# Patient Record
Sex: Male | Born: 1960 | Race: White | Hispanic: No | Marital: Married | Smoking: Never smoker
Health system: Southern US, Community
[De-identification: ages and names within clinical notes are randomized; demographics above are authoritative.]

## PROBLEM LIST (undated history)

## (undated) DIAGNOSIS — E785 Hyperlipidemia, unspecified: Secondary | ICD-10-CM

## (undated) DIAGNOSIS — K219 Gastro-esophageal reflux disease without esophagitis: Secondary | ICD-10-CM

## (undated) DIAGNOSIS — M51369 Other intervertebral disc degeneration, lumbar region without mention of lumbar back pain or lower extremity pain: Secondary | ICD-10-CM

## (undated) DIAGNOSIS — E669 Obesity, unspecified: Secondary | ICD-10-CM

## (undated) DIAGNOSIS — M5136 Other intervertebral disc degeneration, lumbar region: Secondary | ICD-10-CM

## (undated) DIAGNOSIS — E559 Vitamin D deficiency, unspecified: Secondary | ICD-10-CM

## (undated) DIAGNOSIS — I4510 Unspecified right bundle-branch block: Secondary | ICD-10-CM

## (undated) HISTORY — DX: Unspecified right bundle-branch block: I45.10

## (undated) HISTORY — PX: COLONOSCOPY: SHX174

## (undated) HISTORY — DX: Hyperlipidemia, unspecified: E78.5

## (undated) HISTORY — DX: Gastro-esophageal reflux disease without esophagitis: K21.9

## (undated) HISTORY — PX: TONSILLECTOMY: SUR1361

---

## 1998-12-24 ENCOUNTER — Ambulatory Visit (HOSPITAL_BASED_OUTPATIENT_CLINIC_OR_DEPARTMENT_OTHER): Admission: RE | Admit: 1998-12-24 | Discharge: 1998-12-24 | Payer: Self-pay | Admitting: Orthopedic Surgery

## 1999-06-03 ENCOUNTER — Ambulatory Visit (HOSPITAL_BASED_OUTPATIENT_CLINIC_OR_DEPARTMENT_OTHER): Admission: RE | Admit: 1999-06-03 | Discharge: 1999-06-03 | Payer: Self-pay | Admitting: Orthopedic Surgery

## 2005-07-12 ENCOUNTER — Ambulatory Visit: Payer: Self-pay | Admitting: Family Medicine

## 2005-08-11 ENCOUNTER — Ambulatory Visit: Payer: Self-pay | Admitting: Family Medicine

## 2005-08-15 ENCOUNTER — Ambulatory Visit: Payer: Self-pay | Admitting: Family Medicine

## 2006-11-23 ENCOUNTER — Ambulatory Visit: Payer: Self-pay | Admitting: Family Medicine

## 2006-11-23 LAB — CONVERTED CEMR LAB
AST: 35 units/L (ref 0–37)
Bilirubin, Direct: 0.1 mg/dL (ref 0.0–0.3)
Cholesterol: 205 mg/dL (ref 0–200)
Direct LDL: 111.3 mg/dL
GFR calc Af Amer: 71 mL/min
GFR calc non Af Amer: 59 mL/min
Glucose, Bld: 99 mg/dL (ref 70–99)
Potassium: 4.8 meq/L (ref 3.5–5.1)
Sodium: 143 meq/L (ref 135–145)
TSH: 1.42 microintl units/mL (ref 0.35–5.50)
Total CHOL/HDL Ratio: 2.8
Triglycerides: 39 mg/dL (ref 0–149)
VLDL: 8 mg/dL (ref 0–40)

## 2006-11-27 ENCOUNTER — Ambulatory Visit: Payer: Self-pay | Admitting: Family Medicine

## 2006-11-27 DIAGNOSIS — J309 Allergic rhinitis, unspecified: Secondary | ICD-10-CM | POA: Insufficient documentation

## 2007-12-12 ENCOUNTER — Ambulatory Visit: Payer: Self-pay | Admitting: Family Medicine

## 2007-12-12 LAB — CONVERTED CEMR LAB
ALT: 34 units/L (ref 0–53)
AST: 31 units/L (ref 0–37)
Basophils Absolute: 0 10*3/uL (ref 0.0–0.1)
Basophils Relative: 0.4 % (ref 0.0–1.0)
Bilirubin, Direct: 0.2 mg/dL (ref 0.0–0.3)
CO2: 28 meq/L (ref 19–32)
Chloride: 106 meq/L (ref 96–112)
Creatinine, Ser: 1.4 mg/dL (ref 0.4–1.5)
Lymphocytes Relative: 29.9 % (ref 12.0–46.0)
MCHC: 34.3 g/dL (ref 30.0–36.0)
Monocytes Relative: 7.8 % (ref 3.0–12.0)
Neutrophils Relative %: 57.8 % (ref 43.0–77.0)
RBC: 4.58 M/uL (ref 4.22–5.81)
TSH: 1.58 microintl units/mL (ref 0.35–5.50)
Total Bilirubin: 1.3 mg/dL — ABNORMAL HIGH (ref 0.3–1.2)
WBC: 4.8 10*3/uL (ref 4.5–10.5)

## 2007-12-20 ENCOUNTER — Ambulatory Visit: Payer: Self-pay | Admitting: Family Medicine

## 2007-12-20 DIAGNOSIS — E785 Hyperlipidemia, unspecified: Secondary | ICD-10-CM

## 2008-12-23 ENCOUNTER — Ambulatory Visit: Payer: Self-pay | Admitting: Family Medicine

## 2008-12-23 LAB — CONVERTED CEMR LAB
CO2: 29 meq/L (ref 19–32)
Calcium: 9.2 mg/dL (ref 8.4–10.5)
Creatinine, Ser: 1.3 mg/dL (ref 0.4–1.5)
Glucose, Bld: 85 mg/dL (ref 70–99)
TSH: 1.63 microintl units/mL (ref 0.35–5.50)
Total CHOL/HDL Ratio: 3

## 2008-12-29 ENCOUNTER — Ambulatory Visit: Payer: Self-pay | Admitting: Family Medicine

## 2009-11-17 HISTORY — PX: LASIK: SHX215

## 2010-01-11 ENCOUNTER — Ambulatory Visit: Payer: Self-pay | Admitting: Family Medicine

## 2010-01-11 LAB — CONVERTED CEMR LAB
ALT: 25 units/L (ref 0–53)
AST: 29 units/L (ref 0–37)
Bilirubin, Direct: 0.1 mg/dL (ref 0.0–0.3)
Chloride: 107 meq/L (ref 96–112)
Potassium: 4.8 meq/L (ref 3.5–5.1)
TSH: 1.56 microintl units/mL (ref 0.35–5.50)
Total Bilirubin: 0.8 mg/dL (ref 0.3–1.2)
Total CHOL/HDL Ratio: 3

## 2010-01-18 ENCOUNTER — Ambulatory Visit: Payer: Self-pay | Admitting: Family Medicine

## 2010-03-15 ENCOUNTER — Encounter (INDEPENDENT_AMBULATORY_CARE_PROVIDER_SITE_OTHER): Payer: Self-pay | Admitting: *Deleted

## 2010-09-06 NOTE — Letter (Signed)
Summary: Nadara Eaton letter  [place] at [facility]  [address] [place], [place] [address]   Phone: [phone]  Fax: [phone]       03/15/2010 MRN: [id number]  [facility] [address] [place], [place]  [address]  Dear Mr. GUAY,  [facility] [place], and [facility] announce the retirement of Arta Silence, M.D., from full-time practice at the [facility] office effective February 03, 2010 and his plans of returning part-time.  It is important to Dr. Hetty Ely and to our practice that you understand that [facility] has seven physicians in our office for your health care needs.  We will continue to offer the same exceptional care that you have today.    Dr. Hetty Ely has spoken to many of you about his plans for retirement and returning part-time in the fall.   We will continue to work with you through the transition to schedule appointments for you in the office and meet the high standards that [place] is committed to.   Again, it is with great pleasure that we share the news that Dr. Hetty Ely will return to [facility] in October of 2011 with a reduced schedule.    If you have any questions, or would like to request an appointment with one of our physicians, please call us at [phone] and press the option for Scheduling an appointment.  We take pleasure in providing you with excellent patient care and look forward to seeing you at your next office visit.  Our [facility] Physicians are:  Tillman Abide, M.D. Laurita Quint, M.D. Roxy Manns, M.D. Kerby Nora, M.D. Hannah Beat, M.D. Ruthe Mannan, M.D. We proudly welcomed Raechel Ache, M.D. and Eustaquio Boyden, M.D. to the practice in July/August 2011.  Sincerely,  [facility]

## 2010-09-06 NOTE — Assessment & Plan Note (Signed)
Summary: cpx/dlo   Vital Signs:  Patient profile:   50 year old male Height:      70 inches Weight:      217 pounds BMI:     31.25 Temp:     98.6 degrees F oral Pulse rate:   60 / minute Pulse rhythm:   regular BP sitting:   104 / 70  (left arm) Cuff size:   large  Vitals Entered By: Sydell Axon LPN (January 18, 2010 3:00 PM) CC: 30 Minute checkup   History of Present Illness: Pt here for Comp Exam and feeling well. nHe has no complaints today and feeling well. He had a great year for allergies.   Preventive Screening-Counseling & Management  Alcohol-Tobacco     Alcohol drinks/day: 3-4 per week     Alcohol type: wine     Alcohol Counseling: not indicated; patient does not drink     Smoking Status: never     Passive Smoke Exposure: no  Caffeine-Diet-Exercise     Caffeine use/day: 2     Does Patient Exercise: yes     Type of exercise: lifts, runs, bicycle     Times/week: 7  Problems Prior to Update: 1)  Hyperlipidemia  (ICD-272.4) 2)  Health Maintenance Exam  (ICD-V70.0) 3)  Allergy  (ICD-995.3)  Medications Prior to Update: 1)  Claritin 10 Mg  Tabs (Loratadine) .... Takes Seasonally// 1 Tablet By Mouth Daily Prn 2)  Diphenhydramine Hcl 25 Mg  Caps (Diphenhydramine Hcl) .Marland Kitchen.. 1 By Mouth As Needed Allergies  Allergies: No Known Drug Allergies  Past History:  Past Medical History: Last updated: 11/27/2006 GERD (07/2005) Hyperlipidemia (08/2005)  Family History: Last updated: 01/18/2010 Father A 17  BPH Mother A 39 Brother A 51 Hyperactive Thyr I131 Sister A 50  Social History: Last updated: 01/18/2010 Occupation: Art gallery manager, [employer]    [employer]    Mech Engr/ [place] Married 2 Daughters Never Smoked Alcohol use-yes Drug use-no  Risk Factors: Alcohol Use: 3-4 per week (01/18/2010) Caffeine Use: 2 (01/18/2010) Exercise: yes (01/18/2010)  Risk Factors: Smoking Status: never (01/18/2010) Passive Smoke Exposure: no (01/18/2010)  Past Surgical  History: TONSILLECTOMY 7 YOA LASIK (Dr Judie Grieve, [place], [facility]) 11/17/2009  Family History: Father A 34  BPH Mother A 23 Brother A 51 Hyperactive Thyr I131 Sister A 50  Social History: Occupation: Art gallery manager, [employer]    [employer] of [employer]    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 Eyes:  Denies blurring, discharge, and eye pain. ENT:  Denies decreased hearing, earache, and ringing in ears. CV:  Denies chest pain or discomfort, fainting, fatigue, palpitations, shortness of breath with exertion, swelling of feet, and swelling of hands. Resp:  Denies cough, pleuritic, shortness of breath, and wheezing. GI:  Denies abdominal pain, bloody stools, change in bowel habits, constipation, dark tarry stools, diarrhea, indigestion, loss of appetite, nausea, vomiting, vomiting blood, and yellowish skin color. GU:  Denies dysuria, nocturia, urinary frequency, and urinary hesitancy. MS:  Denies joint pain, low back pain, muscle aches, cramps, muscle weakness, and stiffness. Derm:  Denies dryness, itching, and rash. Neuro:  Denies numbness, poor balance, tingling, and tremors.  Physical Exam  General:  Well-developed,well-nourished,in no acute distress; alert,appropriate and cooperative throughout examination Head:  Normocephalic and atraumatic without obvious abnormalities. No apparent alopecia or balding. Sinuses nontender. Eyes:  Conjunctiva clear bilaterally.  Ears:  External ear exam shows no significant lesions or deformities.  Otoscopic examination reveals clear canals, tympanic membranes are intact bilaterally without bulging, retraction, inflammation or discharge. Hearing is grossly normal bilaterally. Nose:  External nasal examination shows no deformity or inflammation. Nasal mucosa are pink and moist without lesions or exudates. Mouth:  Oral mucosa and  oropharynx without lesions or exudates.  Teeth in good repair. Neck:  No deformities, masses, or tenderness noted. Chest Wall:  No deformities, masses, tenderness or gynecomastia noted. Breasts:  No masses or gynecomastia noted Lungs:  Normal respiratory effort, chest expands symmetrically. Lungs are clear to auscultation, no crackles or wheezes. Heart:  Normal rate and regular rhythm. S1 and S2 normal without gallop, murmur, click, rub or other extra sounds. Abdomen:  Bowel sounds positive,abdomen soft and non-tender without masses, organomegaly or hernias noted. Rectal:  No external abnormalities noted. Normal sphincter tone. No rectal masses or tenderness. Externak deflated hemms right sided. G neg. Genitalia:  Testes bilaterally descended without nodularity, tenderness or masses. No scrotal masses or lesions. No penis lesions or urethral discharge. Prostate:  Prostate gland firm and smooth, no enlargement, nodularity, tenderness, mass, asymmetry or induration. 20-30gms. Msk:  No deformity or scoliosis noted of thoracic or lumbar spine.   Pulses:  R and L carotid,radial,femoral,dorsalis pedis and posterior tibial pulses are full and equal bilaterally Extremities:  No clubbing, cyanosis, edema, or deformity noted with normal full range of motion of all joints.   Neurologic:  No cranial nerve deficits noted. Station and gait are normal.  Sensory, motor and coordinative functions appear intact. Skin:  Intact without suspicious lesions or rashes, small 5mm SK on RLLeg. Cervical Nodes:  No lymphadenopathy noted Inguinal Nodes:  No significant adenopathy Psych:  Cognition and judgment appear intact. Alert and cooperative with normal attention span and concentration. No apparent delusions, illusions, hallucinations   Impression & Recommendations:  Problem # 1:  HEALTH MAINTENANCE EXAM (ICD-V70.0) Assessment Comment Only  Reviewed preventive care protocols, scheduled due services, and updated  immunizations.  Problem # 2:  HYPERLIPIDEMIA (ICD-272.4) Assessment: Unchanged OK but always want LDL lower, avoid fatty foods. Labs Reviewed: SGOT: 29 (01/11/2010)   SGPT: 25 (01/11/2010)   HDL:80.70 (01/11/2010), 66.10 (12/23/2008)  LDL:108 (12/23/2008), DEL (11/23/2006)  Chol:211 (01/11/2010), 186 (12/23/2008)  Trig:51.0 (01/11/2010), 60.0 (12/23/2008)  Problem # 3:  ALLERGY (ICD-995.3) Assessment: Improved Weel controlled this year, no meds required.  Complete Medication List: 1)  Claritin 10 Mg Tabs (Loratadine) .... Takes seasonally// 1 tablet by mouth daily prn 2)  Diphenhydramine Hcl 25 Mg Caps (Diphenhydramine hcl) .Marland Kitchen.. 1 by mouth as needed allergies  Patient Instructions: 1)  RTC one year, sooner as needed.  Current Allergies (reviewed today): No known allergies

## 2011-02-11 ENCOUNTER — Encounter: Payer: Self-pay | Admitting: Family Medicine

## 2011-02-28 ENCOUNTER — Other Ambulatory Visit: Payer: Self-pay | Admitting: Family Medicine

## 2011-02-28 DIAGNOSIS — Z0000 Encounter for general adult medical examination without abnormal findings: Secondary | ICD-10-CM

## 2011-02-28 DIAGNOSIS — E785 Hyperlipidemia, unspecified: Secondary | ICD-10-CM

## 2011-03-02 ENCOUNTER — Other Ambulatory Visit (INDEPENDENT_AMBULATORY_CARE_PROVIDER_SITE_OTHER): Payer: 59 | Admitting: Family Medicine

## 2011-03-02 DIAGNOSIS — Z0000 Encounter for general adult medical examination without abnormal findings: Secondary | ICD-10-CM

## 2011-03-02 DIAGNOSIS — E785 Hyperlipidemia, unspecified: Secondary | ICD-10-CM

## 2011-03-02 LAB — BASIC METABOLIC PANEL
BUN: 21 mg/dL (ref 6–23)
CO2: 25 mEq/L (ref 19–32)
Calcium: 8.9 mg/dL (ref 8.4–10.5)
GFR: 69.08 mL/min (ref >60.00)
Glucose, Bld: 91 mg/dL (ref 70–99)

## 2011-03-02 LAB — HEPATIC FUNCTION PANEL
ALT: 47 U/L (ref 0–53)
Alkaline Phosphatase: 64 U/L (ref 39–117)
Bilirubin, Direct: 0 mg/dL (ref 0.0–0.3)
Total Bilirubin: 0.8 mg/dL (ref 0.3–1.2)
Total Protein: 7.2 g/dL (ref 6.0–8.3)

## 2011-03-09 ENCOUNTER — Ambulatory Visit (INDEPENDENT_AMBULATORY_CARE_PROVIDER_SITE_OTHER): Payer: 59 | Admitting: Family Medicine

## 2011-03-09 ENCOUNTER — Encounter: Payer: Self-pay | Admitting: Family Medicine

## 2011-03-09 DIAGNOSIS — T7840XA Allergy, unspecified, initial encounter: Secondary | ICD-10-CM

## 2011-03-09 DIAGNOSIS — B369 Superficial mycosis, unspecified: Secondary | ICD-10-CM | POA: Insufficient documentation

## 2011-03-09 DIAGNOSIS — E785 Hyperlipidemia, unspecified: Secondary | ICD-10-CM

## 2011-03-09 MED ORDER — NYSTATIN 100000 UNIT/GM EX CREA: TOPICAL_CREAM | Freq: Two times a day (BID) | CUTANEOUS | Status: AC

## 2011-03-09 NOTE — Progress Notes (Signed)
Subjective:    Patient ID: Alejandro Decker, male    DOB: 01/07/1961, 50 y.o.   MRN: [id number]  HPI Pt here for Comp Exam. He is doing well, as far as he knows. He has no ailments or complaints.    Review of Systems  Constitutional: Negative for fever, chills, diaphoresis, appetite change, fatigue and unexpected weight change.  HENT: Negative for hearing loss, ear pain, tinnitus and ear discharge.   Eyes: Negative for pain, discharge and visual disturbance.  Respiratory: Negative for cough, shortness of breath and wheezing.   Cardiovascular: Negative for chest pain and palpitations.       No SOB w/ exertion  Gastrointestinal: Negative for nausea, vomiting, abdominal pain, diarrhea, constipation and blood in stool.       No heartburn or swallowing problems.  Genitourinary: Negative for dysuria, frequency and difficulty urinating.       No nocturia  Musculoskeletal: Negative for myalgias, back pain and arthralgias.  Skin: Negative for rash.       No itching or dryness.  Neurological: Negative for tremors and numbness.       No tingling or balance problems.  Hematological: Negative for adenopathy. Does not bruise/bleed easily.  Psychiatric/Behavioral: Negative for dysphoric mood and agitation.   No Known Allergies  Current Outpatient Prescriptions on File Prior to Visit  Medication Sig Dispense Refill  . diphenhydrAMINE (BENADRYL) 25 mg capsule Take 25 mg by mouth as needed.        . loratadine (CLARITIN) 10 MG tablet Take 10 mg by mouth daily as needed.          Past Medical History  Diagnosis Date  . GERD (gastroesophageal reflux disease)   . Hyperlipemia     History   Social History  . Marital Status: Married    Spouse Name: N/A    Number of Children: 2  . Years of Education: N/A   Occupational History  . Engineer, [employer]    Mech Engr/[place]   Social History Main Topics  . Smoking status: Never Smoker   . Smokeless tobacco: Never Used    . Alcohol Use: 3.0 oz/week    6 drink(s) per week     weekly  . Drug Use: No  . Sexually Active: Yes   Other Topics Concern  . Not on file   Social History Narrative   2 daughters    Family History  Problem Relation Age of Onset  . Benign prostatic hyperplasia Father         Objective:   Physical Exam  Constitutional: He is oriented to person, place, and time. He appears well-developed and well-nourished. No distress.  HENT:  Head: Normocephalic and atraumatic.  Right Ear: External ear normal.  Left Ear: External ear normal.  Nose: Nose normal.  Mouth/Throat: Oropharynx is clear and moist.  Eyes: Conjunctivae and EOM are normal. Pupils are equal, round, and reactive to light. Right eye exhibits no discharge. Left eye exhibits no discharge. No scleral icterus.  Neck: Normal range of motion. Neck supple. No thyromegaly present.  Cardiovascular: Normal rate, regular rhythm, normal heart sounds and intact distal pulses.   No murmur heard. Pulmonary/Chest: Effort normal and breath sounds normal. No respiratory distress. He has no wheezes.  Abdominal: Soft. Bowel sounds are normal. He exhibits no distension and no mass. There is no tenderness. There is no rebound and no guarding.  Genitourinary: Rectum normal, prostate normal and penis normal. Guaiac negative stool.  Prostate 20gms, firm symmetric and nontender and smooth. No hernias felt.  Musculoskeletal: Normal range of motion. He exhibits no edema.  Lymphadenopathy:    He has no cervical adenopathy.  Neurological: He is alert and oriented to person, place, and time. Coordination normal.  Skin: Skin is warm and dry. No rash noted. He is not diaphoretic.       Mild erythematous rash on the top of the right foot with more intense erythematous outline, scalloped borders.  Psychiatric: He has a normal mood and affect. His behavior is normal. Judgment and thought content normal.          Assessment & Plan:  HMPE

## 2011-03-09 NOTE — Assessment & Plan Note (Signed)
Well controlled 

## 2011-03-09 NOTE — Assessment & Plan Note (Signed)
Great control. All nos good to terrific.

## 2011-03-09 NOTE — Patient Instructions (Signed)
RTC as needed

## 2011-03-09 NOTE — Assessment & Plan Note (Signed)
L foot. Use Nystatin cream, apply bid for one week after rash looks resolved.

## 2012-03-06 ENCOUNTER — Other Ambulatory Visit: Payer: Self-pay | Admitting: Family Medicine

## 2012-03-06 DIAGNOSIS — E785 Hyperlipidemia, unspecified: Secondary | ICD-10-CM

## 2012-03-11 ENCOUNTER — Other Ambulatory Visit (INDEPENDENT_AMBULATORY_CARE_PROVIDER_SITE_OTHER): Payer: 59

## 2012-03-11 DIAGNOSIS — E785 Hyperlipidemia, unspecified: Secondary | ICD-10-CM

## 2012-03-11 LAB — COMPREHENSIVE METABOLIC PANEL
ALT: 36 U/L (ref 0–53)
Albumin: 4.3 g/dL (ref 3.5–5.2)
CO2: 27 mEq/L (ref 19–32)
Calcium: 9.2 mg/dL (ref 8.4–10.5)
Chloride: 106 mEq/L (ref 96–112)
Creatinine, Ser: 1.2 mg/dL (ref 0.4–1.5)
GFR: 68.79 mL/min (ref >60.00)
Total Protein: 6.9 g/dL (ref 6.0–8.3)

## 2012-03-11 LAB — LIPID PANEL
Cholesterol: 206 mg/dL — ABNORMAL HIGH (ref 0–200)
Total CHOL/HDL Ratio: 3

## 2012-03-14 ENCOUNTER — Encounter: Payer: Self-pay | Admitting: Family Medicine

## 2012-03-14 ENCOUNTER — Ambulatory Visit (INDEPENDENT_AMBULATORY_CARE_PROVIDER_SITE_OTHER): Payer: 59 | Admitting: Family Medicine

## 2012-03-14 VITALS — BP 116/78 | HR 72 | Temp 98.1°F | Ht 70.0 in | Wt 227.0 lb

## 2012-03-14 DIAGNOSIS — E785 Hyperlipidemia, unspecified: Secondary | ICD-10-CM

## 2012-03-14 DIAGNOSIS — Z0000 Encounter for general adult medical examination without abnormal findings: Secondary | ICD-10-CM

## 2012-03-14 NOTE — Assessment & Plan Note (Signed)
Preventative protocols reviewed and updated unless pt declined. Discussed healthy diet/lifestyle. 

## 2012-03-14 NOTE — Patient Instructions (Signed)
Good to see you today, call us with questions. Return in 1 year or as needed

## 2012-03-14 NOTE — Progress Notes (Signed)
Subjective:    Patient ID: Alejandro Decker, male    DOB: Apr 30, 1961, 51 y.o.   MRN: [id number]  HPI CC: CPE  No questions or concerns today.  Preventative: Last tetanus 2010 No fmhx colon or prostate cancer.  Nocturia x1, strong stream.  No BM changes, no blood in stool noted.  Caffeine: 4-5 cups coffee/day Lives with wife and 2 daughters, 3 dogs Occupation: Production designer, theatre/television/film at [employer]: works out 6d/wk, cardio and [employer] Diet: some water, fruits/vegetables daily, no fast food  Medications and allergies reviewed and updated in chart.  Past histories reviewed and updated if relevant as below. Patient Active Problem List  Diagnosis  . HYPERLIPIDEMIA  . ALLERGY   Past Medical History  Diagnosis Date  . GERD (gastroesophageal reflux disease)   . Hyperlipemia    Past Surgical History  Procedure Date  . Tonsillectomy 7 yoa  . Lasik 11/17/2009    Dr. Judie Grieve, [place], [facility]   History  Substance Use Topics  . Smoking status: Never Smoker   . Smokeless tobacco: Never Used  . Alcohol Use: 3.0 oz/week    6 drink(s) per week     weekly   Family History  Problem Relation Age of Onset  . Benign prostatic hyperplasia Father   . Diabetes Maternal Grandfather   . Parkinsonism Paternal Grandmother   . Heart disease Maternal Grandfather   . Stroke Maternal Grandmother   . Coronary artery disease Neg Hx   . Cancer Neg Hx    No Known Allergies Current Outpatient Prescriptions on File Prior to Visit  Medication Sig Dispense Refill  . diphenhydrAMINE (BENADRYL) 25 mg capsule Take 25 mg by mouth as needed.        . loratadine (CLARITIN) 10 MG tablet Take 10 mg by mouth daily as needed.           Review of Systems  Constitutional: Negative for fever, chills, activity change, appetite change, fatigue and unexpected weight change.  HENT: Negative for hearing loss and neck pain.   Eyes: Negative for visual disturbance.  Respiratory: Negative for cough, chest tightness,  shortness of breath and wheezing.   Cardiovascular: Negative for chest pain, palpitations and leg swelling.  Gastrointestinal: Negative for nausea, vomiting, abdominal pain, diarrhea, constipation, blood in stool and abdominal distention.  Genitourinary: Negative for hematuria and difficulty urinating.  Musculoskeletal: Negative for myalgias and arthralgias.  Skin: Negative for rash.  Neurological: Negative for dizziness, seizures, syncope and headaches.  Hematological: Does not bruise/bleed easily.  Psychiatric/Behavioral: Negative for dysphoric mood. The patient is not nervous/anxious.        Objective:   Physical Exam  Nursing note and vitals reviewed. Constitutional: He is oriented to person, place, and time. He appears well-developed and well-nourished. No distress.  HENT:  Head: Normocephalic and atraumatic.  Right Ear: External ear normal.  Left Ear: External ear normal.  Nose: Nose normal.  Mouth/Throat: Oropharynx is clear and moist. No oropharyngeal exudate.  Eyes: Conjunctivae and EOM are normal. Pupils are equal, round, and reactive to light. No scleral icterus.  Neck: Normal range of motion. Neck supple. No thyromegaly present.  Cardiovascular: Normal rate, regular rhythm, normal heart sounds and intact distal pulses.   No murmur heard. Pulses:      Radial pulses are 2+ on the right side, and 2+ on the left side.  Pulmonary/Chest: Effort normal and breath sounds normal. No respiratory distress. He has no wheezes. He has no rales.  Abdominal: Soft. Bowel sounds are normal.  He exhibits no distension and no mass. There is no tenderness. There is no rebound and no guarding.  Musculoskeletal: Normal range of motion. He exhibits no edema.  Lymphadenopathy:    He has no cervical adenopathy.  Neurological: He is alert and oriented to person, place, and time.       CN grossly intact, station and gait intact  Skin: Skin is warm and dry. No rash noted.  Psychiatric: He has a  normal mood and affect. His behavior is normal. Judgment and thought content normal.       Assessment & Plan:

## 2012-03-14 NOTE — Assessment & Plan Note (Signed)
Very mild HLD.  Stable off meds. Discussed healthy diet changes.

## 2013-01-13 ENCOUNTER — Telehealth: Payer: Self-pay | Admitting: Family Medicine

## 2013-01-13 NOTE — Telephone Encounter (Signed)
To pcp   Hannah Beat, MD 01/13/2013, 4:51 PM

## 2013-01-13 NOTE — Telephone Encounter (Signed)
Will see then. 

## 2013-01-13 NOTE — Telephone Encounter (Signed)
Patient Information:  Caller Name: Cleotis  Phone: [phone]  Patient: Alejandro Decker, Alejandro Decker  Gender: Male  DOB: 03/11/1961  Age: 52 Years  PCP: Eustaquio Boyden [facility])  Office Follow Up:  Does the office need to follow up with this patient?: No  Instructions For The Office: N/A  RN Note:  No nasal congestion.   Symptoms  Reason For Call & Symptoms: Ear congestion with fluid and scratchy throat.  Symptoms began after swimming; had sensation of fluid in left ear for past 2 weeks and now both ears congested for past 3 days.  Reviewed Health History In EMR: Yes  Reviewed Medications In EMR: Yes  Reviewed Allergies In EMR: Yes  Reviewed Surgeries / Procedures: Yes  Date of Onset of Symptoms: 12/30/2012  Treatments Tried: OTC allergy medication  Treatments Tried Worked: No  Guideline(s) Used:  Ear - Congestion  Disposition Per Guideline:   See Within 3 Days in Office  Reason For Disposition Reached:   Ear congestion present > 48 hours  Advice Given:  Reassurance:  Definition: Ear congestion is the medical term used to describe symptoms of a stuffy, full, or plugged sensation in the ear. People also sometimes describe crackling or popping noises in the ear. Sometimes hearing seems slightly muffled.  Eustacian tube: There is a small collapsible tube that runs between the middle ear and the nose. Normally, it permits tiny amounts of air to move in and out of the middle ear. When the tube gets blocked, air or fluid can build up behind the ear drum (tympanic membrane). This causes the symptoms of ear congestion.  Here is some care advice that should help.  Causes  Blowing the nose too hard can also push air and fluid into the eustachian tube.  Air travelers can get ear congestion. This happens because of the changes in air pressure as the air plane takes off and lands.  Treatment - Chewing and Swallowing:   Try chewing gum.  You can also try swallowing water while pinching  your nostrils closed. The reason this works is that it creates a small vacuum in the nose. This helps the eustachian tube to open up.  Expected Course:   The symptoms usually get better within 2 days (48 hours) with treatment.  It is safe to swim.  Call Back If:   Ear pain or fever occurs  You become worse.  Patient Will Follow Care Advice:  YES  Appointment Scheduled:  01/14/2013 14:00:00 Appointment Scheduled Provider:  Eustaquio Boyden [facility])

## 2013-01-14 ENCOUNTER — Ambulatory Visit (INDEPENDENT_AMBULATORY_CARE_PROVIDER_SITE_OTHER): Payer: 59 | Admitting: Family Medicine

## 2013-01-14 ENCOUNTER — Encounter: Payer: Self-pay | Admitting: Family Medicine

## 2013-01-14 VITALS — BP 106/64 | HR 68 | Temp 98.1°F | Wt 230.0 lb

## 2013-01-14 DIAGNOSIS — H6593 Unspecified nonsuppurative otitis media, bilateral: Secondary | ICD-10-CM | POA: Insufficient documentation

## 2013-01-14 DIAGNOSIS — H6590 Unspecified nonsuppurative otitis media, unspecified ear: Secondary | ICD-10-CM

## 2013-01-14 MED ORDER — FLUTICASONE PROPIONATE 50 MCG/ACT NA SUSP: 2.0000 | Freq: Every day | NASAL | Status: DC

## 2013-01-14 NOTE — Patient Instructions (Signed)
I do think you have fluid remaining in both ears Treat with ibuprofen as needed, and flonase nasal steroid as well as nasal saline as needed. Let us know if persisting into next few weeks.

## 2013-01-14 NOTE — Assessment & Plan Note (Signed)
Without infection. Treat with NSAID, INS, and supportive care (increased fluids). If persistent to notify us, consider abx course.

## 2013-01-14 NOTE — Progress Notes (Signed)
  Subjective:    Patient ID: Alejandro Decker, male    DOB: [birth year], 52 y.o.   MRN: [id number]  HPI CC: bilateral ear congestion  2 wk h/o fluid in left ear.  Started after swimming.  Last Friday noticing fluid in right ear.  Some soreness in sinuses. No fevers/chills, ear drainage, tinnitus, hearing changes.  No rhinorhea, coryza or cough.  No PNdrainage or ST.   + muffled hearing.    Normally h/o seasonal allergies in early spring and fall.  Has tried allertech OTC for this. No sick contacts at home.  Past Medical History  Diagnosis Date  . GERD (gastroesophageal reflux disease)   . Hyperlipemia      Review of Systems Per HPI    Objective:   Physical Exam  Nursing note and vitals reviewed. Constitutional: He appears well-developed and well-nourished. No distress.  HENT:  Head: Normocephalic and atraumatic.  Right Ear: Hearing, tympanic membrane, external ear and ear canal normal.  Left Ear: Hearing, tympanic membrane, external ear and ear canal normal.  Nose: Mucosal edema present. No rhinorrhea. Right sinus exhibits no maxillary sinus tenderness and no frontal sinus tenderness. Left sinus exhibits no maxillary sinus tenderness and no frontal sinus tenderness.  Mouth/Throat: Oropharynx is clear and moist. No oropharyngeal exudate.  Some decreased light refiex bilaterally with increased opacity of TMs, fluid behind bilateral ears. Nasal mucosal inflammation  Eyes: Conjunctivae and EOM are normal. Pupils are equal, round, and reactive to light. No scleral icterus.  Neck: Normal range of motion. Neck supple.  Lymphadenopathy:    He has no cervical adenopathy.  Skin: Skin is warm and dry. No rash noted.       Assessment & Plan:

## 2013-03-09 ENCOUNTER — Other Ambulatory Visit: Payer: Self-pay | Admitting: Family Medicine

## 2013-03-09 DIAGNOSIS — E785 Hyperlipidemia, unspecified: Secondary | ICD-10-CM

## 2013-03-11 ENCOUNTER — Other Ambulatory Visit (INDEPENDENT_AMBULATORY_CARE_PROVIDER_SITE_OTHER): Payer: 59

## 2013-03-11 ENCOUNTER — Ambulatory Visit (INDEPENDENT_AMBULATORY_CARE_PROVIDER_SITE_OTHER): Payer: 59 | Admitting: Family Medicine

## 2013-03-11 ENCOUNTER — Encounter: Payer: Self-pay | Admitting: Family Medicine

## 2013-03-11 VITALS — BP 108/70 | HR 64 | Temp 98.3°F | Wt 230.2 lb

## 2013-03-11 DIAGNOSIS — E785 Hyperlipidemia, unspecified: Secondary | ICD-10-CM

## 2013-03-11 DIAGNOSIS — Z0000 Encounter for general adult medical examination without abnormal findings: Secondary | ICD-10-CM

## 2013-03-11 DIAGNOSIS — I8001 Phlebitis and thrombophlebitis of superficial vessels of right lower extremity: Secondary | ICD-10-CM | POA: Insufficient documentation

## 2013-03-11 DIAGNOSIS — I8000 Phlebitis and thrombophlebitis of superficial vessels of unspecified lower extremity: Secondary | ICD-10-CM

## 2013-03-11 LAB — COMPREHENSIVE METABOLIC PANEL
Albumin: 4.2 g/dL (ref 3.5–5.2)
CO2: 24 mEq/L (ref 19–32)
Calcium: 9.1 mg/dL (ref 8.4–10.5)
Chloride: 108 mEq/L (ref 96–112)
GFR: 55.88 mL/min — ABNORMAL LOW (ref >60.00)
Glucose, Bld: 91 mg/dL (ref 70–99)
Potassium: 4.4 mEq/L (ref 3.5–5.1)
Sodium: 140 mEq/L (ref 135–145)
Total Protein: 6.9 g/dL (ref 6.0–8.3)

## 2013-03-11 LAB — LIPID PANEL: Triglycerides: 47 mg/dL (ref 0.0–149.0)

## 2013-03-11 MED ORDER — CEPHALEXIN 500 MG PO CAPS: 500.0000 mg | ORAL_CAPSULE | Freq: Three times a day (TID) | ORAL | Status: DC

## 2013-03-11 NOTE — Patient Instructions (Addendum)
I think you have vein inflammation after bug bite. Treat with ibuprofen 600mg  twice daily with food for 5 days as well as antibiotic three times daily for 7 days. Ice ankle, continue to elevate when resting, but ok to exercise (back off running for next week) Let us know if not improving as expected or any worsening.

## 2013-03-11 NOTE — Progress Notes (Signed)
  Subjective:    Patient ID: Alejandro Decker, male    DOB: 1960-09-08, 52 y.o.   MRN: [id number]  HPI CC: check R ankle swelling  Noticed some swelling of R ankle about 2 weeks, noticed red streak which seems to be enlarging.  Pain and swelling worsens 1 mi after starts running, notes mild swelling after eliptical but worse with run.    No fevers/chills, nausea, rashes.   Did have lateral ankle bug bites last week.  No inciting trauma/injury.  No recent prolonged immobility.  Not on any hormonal meds.  No personal or family h/o blood clots.  So far has taken ibuprofen 400mg  and icing after exercise which helps swelling.  Past Medical History  Diagnosis Date  . GERD (gastroesophageal reflux disease)   . Hyperlipemia    Review of Systems Per HPI    Objective:   Physical Exam  Nursing note and vitals reviewed. Constitutional: He appears well-developed and well-nourished. No distress.  Musculoskeletal: He exhibits no edema.       Legs: 42cm calf circ bilaterally Tender to palpation R foot superficial lateral vein as it travels up leg, with erythema and edema along course of vein No palp cords, no calf pain FROM of bilateral ankles. No pain at lateral ankle joint bilaterally No popliteal fullness  Skin: Skin is warm and dry. There is erythema.  4 healing scabs along lateral right ankle       Assessment & Plan:

## 2013-03-11 NOTE — Assessment & Plan Note (Addendum)
After bug bite ?flea. Treat with nsaid and antibiotic. Supportive care as per instructions. Update if not improving as expected. Not consistent wit hDVT

## 2013-03-12 LAB — LDL CHOLESTEROL, DIRECT: Direct LDL: 103.8 mg/dL

## 2013-03-17 ENCOUNTER — Encounter: Payer: Self-pay | Admitting: Family Medicine

## 2013-03-17 ENCOUNTER — Ambulatory Visit (INDEPENDENT_AMBULATORY_CARE_PROVIDER_SITE_OTHER): Payer: 59 | Admitting: Family Medicine

## 2013-03-17 VITALS — BP 134/82 | HR 60 | Temp 98.2°F | Ht 70.0 in | Wt 231.0 lb

## 2013-03-17 DIAGNOSIS — I8001 Phlebitis and thrombophlebitis of superficial vessels of right lower extremity: Secondary | ICD-10-CM

## 2013-03-17 DIAGNOSIS — Z0000 Encounter for general adult medical examination without abnormal findings: Secondary | ICD-10-CM

## 2013-03-17 DIAGNOSIS — I8000 Phlebitis and thrombophlebitis of superficial vessels of unspecified lower extremity: Secondary | ICD-10-CM

## 2013-03-17 DIAGNOSIS — Z1211 Encounter for screening for malignant neoplasm of colon: Secondary | ICD-10-CM

## 2013-03-17 NOTE — Patient Instructions (Signed)
Pass by lab for stool kit. Good to see you today, call us with questions. Increase water intake.

## 2013-03-17 NOTE — Progress Notes (Signed)
Subjective:    Patient ID: Alejandro Decker, male    DOB: Sep 13, 1960, 52 y.o.   MRN: [id number]  HPI CC: CPE  Preventative:  Colon cancer screening - discussed, would like stool kit Prostate cancer screening  - discussed. Would like to start screening next year. Last tetanus 2010  Flu shot - doesn't receive.  Seat belt use discussed. Sunscreen use discussed.  No suspicious moles.  Caffeine: 2-3 cups coffee/day  Lives with wife and 2 daughters, 3 dogs  Occupation: Production designer, theatre/television/film at [employer]: works out 6d/wk, cardio and [employer] Diet: some water, fruits/vegetables daily, no fast food  Medications and allergies reviewed and updated in chart.  Past histories reviewed and updated if relevant as below. Patient Active Problem List   Diagnosis Date Noted  . Superficial thrombophlebitis of right leg 03/11/2013  . Bilateral serous otitis media 01/14/2013  . Healthcare maintenance 03/14/2012  . HYPERLIPIDEMIA 12/20/2007  . ALLERGY 11/27/2006   Past Medical History  Diagnosis Date  . GERD (gastroesophageal reflux disease)   . Hyperlipemia    Past Surgical History  Procedure Laterality Date  . Tonsillectomy  7 yoa  . Lasik  11/17/2009    Dr. Judie Grieve, [facility], [facility]   History  Substance Use Topics  . Smoking status: Never Smoker   . Smokeless tobacco: Never Used  . Alcohol Use: 3.0 oz/week    6 drink(s) per week     Comment: weekly   Family History  Problem Relation Age of Onset  . Benign prostatic hyperplasia Father   . Diabetes Maternal Grandfather   . Parkinsonism Paternal Grandmother   . Heart disease Maternal Grandfather   . Stroke Maternal Grandmother   . Coronary artery disease Neg Hx   . Cancer Neg Hx    No Known Allergies Current Outpatient Prescriptions on File Prior to Visit  Medication Sig Dispense Refill  . cephALEXin (KEFLEX) 500 MG capsule Take 1 capsule (500 mg total) by mouth 3 (three) times daily.  21 capsule  0  . diphenhydrAMINE (BENADRYL) 25  mg capsule Take 25 mg by mouth as needed.        . fluticasone (FLONASE) 50 MCG/ACT nasal spray Place 2 sprays into the nose daily.  16 g  1  . loratadine (CLARITIN) 10 MG tablet Take 10 mg by mouth daily as needed.         No current facility-administered medications on file prior to visit.     Review of Systems  Constitutional: Negative for fever, chills, activity change, appetite change, fatigue and unexpected weight change.  HENT: Negative for hearing loss and neck pain.   Eyes: Negative for visual disturbance.  Respiratory: Negative for cough, chest tightness, shortness of breath and wheezing.   Cardiovascular: Negative for chest pain, palpitations and leg swelling.  Gastrointestinal: Negative for nausea, vomiting, abdominal pain, diarrhea, constipation, blood in stool and abdominal distention.  Genitourinary: Negative for hematuria and difficulty urinating.  Musculoskeletal: Negative for myalgias and arthralgias.  Skin: Negative for rash.  Neurological: Negative for dizziness, seizures, syncope and headaches.  Hematological: Negative for adenopathy. Does not bruise/bleed easily.  Psychiatric/Behavioral: Negative for dysphoric mood. The patient is not nervous/anxious.        Objective:   Physical Exam  Nursing note and vitals reviewed. Constitutional: He is oriented to person, place, and time. He appears well-developed and well-nourished. No distress.  muscular  HENT:  Head: Normocephalic and atraumatic.  Right Ear: Hearing, tympanic membrane, external ear and ear canal normal.  Left Ear: Hearing, tympanic membrane, external ear and ear canal normal.  Nose: Nose normal.  Mouth/Throat: Oropharynx is clear and moist. No oropharyngeal exudate.  Eyes: Conjunctivae and EOM are normal. Pupils are equal, round, and reactive to light. No scleral icterus.  Neck: Normal range of motion. Neck supple. No thyromegaly present.  Cardiovascular: Normal rate, regular rhythm, normal heart  sounds and intact distal pulses.   No murmur heard. Pulses:      Radial pulses are 2+ on the right side, and 2+ on the left side.  Pulmonary/Chest: Effort normal and breath sounds normal. No respiratory distress. He has no wheezes. He has no rales.  Abdominal: Soft. Bowel sounds are normal. He exhibits no distension and no mass. There is no tenderness. There is no rebound and no guarding.  Musculoskeletal: Normal range of motion. He exhibits no edema.  Resolving superficial thrombophlebitis  Lymphadenopathy:    He has no cervical adenopathy.  Neurological: He is alert and oriented to person, place, and time.  CN grossly intact, station and gait intact  Skin: Skin is warm and dry. No rash noted.  Psychiatric: He has a normal mood and affect. His behavior is normal. Judgment and thought content normal.       Assessment & Plan:

## 2013-03-17 NOTE — Assessment & Plan Note (Signed)
Preventative protocols reviewed and updated unless pt declined. Discussed healthy diet and lifestyle.  Td 2010. Start prostate screening next year.

## 2013-03-17 NOTE — Assessment & Plan Note (Signed)
Slowly resolving 

## 2013-04-09 ENCOUNTER — Other Ambulatory Visit: Payer: 59

## 2013-04-09 DIAGNOSIS — Z1211 Encounter for screening for malignant neoplasm of colon: Secondary | ICD-10-CM

## 2013-04-09 LAB — FECAL OCCULT BLOOD, IMMUNOCHEMICAL: Fecal Occult Bld: NEGATIVE

## 2013-04-10 ENCOUNTER — Encounter: Payer: Self-pay | Admitting: Family Medicine

## 2013-04-10 ENCOUNTER — Encounter: Payer: Self-pay | Admitting: *Deleted

## 2013-09-17 ENCOUNTER — Ambulatory Visit (INDEPENDENT_AMBULATORY_CARE_PROVIDER_SITE_OTHER): Payer: 59 | Admitting: Family Medicine

## 2013-09-17 ENCOUNTER — Encounter: Payer: Self-pay | Admitting: Family Medicine

## 2013-09-17 VITALS — BP 120/80 | HR 67 | Temp 98.1°F | Ht 70.0 in | Wt 232.5 lb

## 2013-09-17 DIAGNOSIS — R21 Rash and other nonspecific skin eruption: Secondary | ICD-10-CM | POA: Insufficient documentation

## 2013-09-17 MED ORDER — PREDNISONE 10 MG PO TABS: ORAL_TABLET | ORAL | Status: DC

## 2013-09-17 NOTE — Progress Notes (Signed)
Pre-visit discussion using our clinic review tool. No additional management support is needed unless otherwise documented below in the visit note.  

## 2013-09-17 NOTE — Patient Instructions (Signed)
I sent prednisone to your pharmacy  Follow up with Dr Darnell Level sometime next week  Continue benadryl for sleep and itch Avoid color and fragrance in products and also watch out for high allergenic foods   If worse at any point or if fever or cold symptoms - please let us know asap

## 2013-09-17 NOTE — Progress Notes (Signed)
Subjective:    Patient ID: Alejandro Decker, male    DOB: 11/21/1960, 53 y.o.   MRN: [id number]  HPI Here with a rash   End of January- noticed a few dry blotches on arms and hands -thought it was dry skin  2 weeks ago -some more areas on back of arms - and he changed detergents to dreft  Last week got much worse - red all over trunk  Is itchy  On front part of body - neck to ankles (not on scalp to face or back)  More itchy in warm areas   Before holidays he was sick - uri/ seasonal allergies -throat was a little sore  No fever -never had an antibiotic and feels fine now   Started taking benadryl and cetrizine  Also otc hydrocortisone cream Changed detergent to arm and hammer free and tea tree soap  No other new products  Diet did not change - is staying away from chocolate/alcohol/spicy food  Does not go to tanning bed  Does go to a gym - and they began using a new product to clean equiptment recently  Patient Active Problem List   Diagnosis Date Noted  . Superficial thrombophlebitis of right leg 03/11/2013  . Bilateral serous otitis media 01/14/2013  . Healthcare maintenance 03/14/2012  . HYPERLIPIDEMIA 12/20/2007  . ALLERGY 11/27/2006   Past Medical History  Diagnosis Date  . GERD (gastroesophageal reflux disease)   . Hyperlipemia    Past Surgical History  Procedure Laterality Date  . Tonsillectomy  7 yoa  . Lasik  11/17/2009    Dr. Gaspar Bidding, Gerlene Burdock, [facility]   History  Substance Use Topics  . Smoking status: Never Smoker   . Smokeless tobacco: Never Used  . Alcohol Use: 3.0 oz/week    6 drink(s) per week     Comment: weekly   Family History  Problem Relation Age of Onset  . Benign prostatic hyperplasia Father   . Diabetes Maternal Grandfather   . Parkinsonism Paternal Grandmother   . Heart disease Maternal Grandfather   . Stroke Maternal Grandmother   . Coronary artery disease Neg Hx   . Cancer Neg Hx    No Known Allergies Current Outpatient  Prescriptions on File Prior to Visit  Medication Sig Dispense Refill  . diphenhydrAMINE (BENADRYL) 25 mg capsule Take 50 mg by mouth at bedtime as needed.        No current facility-administered medications on file prior to visit.    Review of Systems Review of Systems  Constitutional: Negative for fever, appetite change, fatigue and unexpected weight change.  Eyes: Negative for pain and visual disturbance.  Respiratory: Negative for cough and shortness of breath.   Cardiovascular: Negative for cp or palpitations    Gastrointestinal: Negative for nausea, diarrhea and constipation.  Genitourinary: Negative for urgency and frequency.  Skin: Negative for pallor and pos for itchy rash  MSK neg for joint pain or swelling  Neurological: Negative for weakness, light-headedness, numbness and headaches.  Hematological: Negative for adenopathy. Does not bruise/bleed easily.  Psychiatric/Behavioral: Negative for dysphoric mood. The patient is not nervous/anxious.         Objective:   Physical Exam  Constitutional: He appears well-developed and well-nourished. No distress.  Well appearing muscular male  HENT:  Head: Normocephalic and atraumatic.  Right Ear: External ear normal.  Left Ear: External ear normal.  Nose: Nose normal.  Mouth/Throat: Oropharynx is clear and moist.  No coryza Throat clear  No mouth  rash/ ulcers or koplik spots   Eyes: Conjunctivae and EOM are normal. Pupils are equal, round, and reactive to light. Right eye exhibits no discharge. Left eye exhibits no discharge. No scleral icterus.  Neck: Normal range of motion. Neck supple. No JVD present. No thyromegaly present.  Cardiovascular: Normal rate, regular rhythm and intact distal pulses.  Exam reveals no gallop.   No murmur heard. Pulmonary/Chest: Effort normal and breath sounds normal. No respiratory distress. He has no wheezes. He has no rales. He exhibits no tenderness.  Abdominal: Soft. He exhibits no distension  and no mass. There is no tenderness.  No hsm  Musculoskeletal: He exhibits no edema and no tenderness.  No acute joint changes  No varicosities   Lymphadenopathy:    He has no cervical adenopathy.  Neurological: He is alert. He has normal reflexes. No cranial nerve deficit. He exhibits normal muscle tone. Coordination normal.  Skin: Skin is warm and dry. Rash noted. No petechiae and no purpura noted. Rash is maculopapular. Rash is not vesicular. No cyanosis. No pallor. Nails show no clubbing.  Erythematous maculopapular rash over anterior trunk and extremities -worse in warmer areas/ confluent  Few patches of dry skin with scale dispersed on chest and arms   Head/ face/scalp/ palms and soles are spared, and back of body is spared  No excoriations   Psychiatric: He has a normal mood and affect.          Assessment & Plan:

## 2013-09-18 NOTE — Assessment & Plan Note (Signed)
Appearance is consistent with drug rash-but no exp Disc household contacts and products/ no new foods  Was exp to new cleanser at the gym No constitutional or uri symptoms  Strangely-only present on anterior body -sparing his back entirely  Has used antihistamine for itch-limited success  Disc use of "free" products  pred taper -30 mg  F/u pcp next wk  Enc to update if any new symptoms whatsoever  He will also keep a journal of contacts to chemicals/ foods

## 2013-09-25 ENCOUNTER — Encounter: Payer: Self-pay | Admitting: Family Medicine

## 2013-09-25 ENCOUNTER — Ambulatory Visit (INDEPENDENT_AMBULATORY_CARE_PROVIDER_SITE_OTHER): Payer: 59 | Admitting: Family Medicine

## 2013-09-25 VITALS — BP 122/84 | HR 60 | Temp 98.1°F | Wt 233.5 lb

## 2013-09-25 DIAGNOSIS — R21 Rash and other nonspecific skin eruption: Secondary | ICD-10-CM

## 2013-09-25 NOTE — Progress Notes (Signed)
Pre visit review using our clinic review tool, if applicable. No additional management support is needed unless otherwise documented below in the visit note. 

## 2013-09-25 NOTE — Patient Instructions (Signed)
I'm not sure where this rash came from either. I would suggest starting moisturizing regularly with aveeno or eucerin creams. Use dove unscented soap and light fragrance free detergent. Let me know if rash is recurring or returning for further workup.

## 2013-09-25 NOTE — Progress Notes (Signed)
   BP 122/84  Pulse 60  Temp(Src) 98.1 F (36.7 C) (Oral)  Wt 233 lb 8 oz (105.915 kg)  SpO2 97%   CC: f/u rash  Subjective:    Patient ID: Alejandro Decker, male    DOB: January 19, 1961, 53 y.o.   MRN: [id number]  HPI: NGHIA MCENTEE is a 53 y.o. male presenting on 09/25/2013 with Rash  See Dr. Marliss Coots prior note for details.  No new meds. No new lotions, detergents, soap, shampoos.  No new foods. No oral lesions, no fevers/chills, no joint pains or nausea/vomiting, dyspnea, tongue swelling  Red blotchy rash for last 1.5 months, more severe over last 3 weeks.  Itchy rash, rash popped up and stayed.  Describes hive like rash Tried cetirizine, benadryl, cortisone cream. No sick contacts at home.  No rash at home.  Avoiding fish, shellfish, tomatoes, spicy foods and chocolate, nuts just in case.  Does go to a gym - and they began using a new product to clean equiptment recently  Last visit impression - appearance consistent with drug rash-but no exp  Strangely-only present on anterior body -sparing his back entirely  pred taper -30 mg.  Finished prednisone taper this morning.  Rash improving.  Relevant past medical, surgical, family and social history reviewed and updated. Allergies and medications reviewed and updated. Current Outpatient Prescriptions on File Prior to Visit  Medication Sig  . cetirizine (KLS ALLER-TEC) 10 MG tablet Take 10 mg by mouth daily.  . diphenhydrAMINE (BENADRYL) 25 mg capsule Take 50 mg by mouth at bedtime as needed.   . hydrocortisone cream 1 % Apply 1 application topically 2 (two) times daily.   No current facility-administered medications on file prior to visit.    Review of Systems Per HPI unless specifically indicated above    Objective:    BP 122/84  Pulse 60  Temp(Src) 98.1 F (36.7 C) (Oral)  Wt 233 lb 8 oz (105.915 kg)  SpO2 97%  Physical Exam  Nursing note and vitals reviewed. Constitutional: He appears well-developed and  well-nourished. No distress.  HENT:  Mouth/Throat: Oropharynx is clear and moist. No oropharyngeal exudate.  No oral lesions  Skin: Skin is warm and dry. Rash noted. There is erythema.     Erythematous pruritic maculopapular rash on abd/chest, mild erythema on back. Pruritic patches with excoriations on bilateral arms and legs, groin Spares face and scalp.       Assessment & Plan:   Problem List Items Addressed This Visit   Skin rash - Primary     ?allergic rash to unknown trigger vs other cause of plaque like rash on extremities - ?lichen planus or pityriasis rosea (although no herald patch).   Regardless, seems to be improving after steroid course. Recommended continue treatment course, continue topical cortisone and prn benadryl. Also encouraged regular moisturizing with fragrance free cream. No systemic sxs today.  If recurring rash, consider basic blood work including CMP, CBC, CRP and ANA and referral to derm vs biopsy. Pt agrees with plan.        Follow up plan: Return if symptoms worsen or fail to improve.

## 2013-09-26 NOTE — Assessment & Plan Note (Addendum)
?  allergic rash to unknown trigger vs other cause of plaque like rash on extremities - ?lichen planus or pityriasis rosea (although no herald patch).   Regardless, seems to be improving after steroid course. Recommended continue treatment course, continue topical cortisone and prn benadryl. Also encouraged regular moisturizing with fragrance free cream. No systemic sxs today.  If recurring rash, consider basic blood work including CMP, CBC, CRP and ANA and referral to derm vs biopsy. Pt agrees with plan.

## 2013-10-16 ENCOUNTER — Encounter: Payer: Self-pay | Admitting: Family Medicine

## 2013-10-16 ENCOUNTER — Ambulatory Visit (INDEPENDENT_AMBULATORY_CARE_PROVIDER_SITE_OTHER): Payer: 59 | Admitting: Family Medicine

## 2013-10-16 VITALS — BP 110/80 | HR 60 | Temp 98.0°F | Wt 231.8 lb

## 2013-10-16 DIAGNOSIS — R21 Rash and other nonspecific skin eruption: Secondary | ICD-10-CM

## 2013-10-16 LAB — CBC WITH DIFFERENTIAL/PLATELET
BASOS ABS: 0 10*3/uL (ref 0.0–0.1)
Basophils Relative: 0.7 % (ref 0.0–3.0)
EOS ABS: 0.4 10*3/uL (ref 0.0–0.7)
Eosinophils Relative: 6 % — ABNORMAL HIGH (ref 0.0–5.0)
HCT: 43.5 % (ref 39.0–52.0)
HEMOGLOBIN: 14.9 g/dL (ref 13.0–17.0)
LYMPHS PCT: 30.8 % (ref 12.0–46.0)
Lymphs Abs: 2 10*3/uL (ref 0.7–4.0)
MCHC: 34.3 g/dL (ref 30.0–36.0)
MCV: 92.4 fl (ref 78.0–100.0)
MONO ABS: 0.4 10*3/uL (ref 0.1–1.0)
Monocytes Relative: 5.8 % (ref 3.0–12.0)
NEUTROS ABS: 3.8 10*3/uL (ref 1.4–7.7)
NEUTROS PCT: 56.7 % (ref 43.0–77.0)
Platelets: 266 10*3/uL (ref 150.0–400.0)
RBC: 4.71 Mil/uL (ref 4.22–5.81)
RDW: 12.6 % (ref 11.5–14.6)
WBC: 6.6 10*3/uL (ref 4.5–10.5)

## 2013-10-16 LAB — COMPREHENSIVE METABOLIC PANEL
ALT: 35 U/L (ref 0–53)
AST: 46 U/L — ABNORMAL HIGH (ref 0–37)
Albumin: 4.3 g/dL (ref 3.5–5.2)
Alkaline Phosphatase: 65 U/L (ref 39–117)
BUN: 22 mg/dL (ref 6–23)
CALCIUM: 9.3 mg/dL (ref 8.4–10.5)
CHLORIDE: 106 meq/L (ref 96–112)
CO2: 25 meq/L (ref 19–32)
Creatinine, Ser: 1.4 mg/dL (ref 0.4–1.5)
GFR: 57.13 mL/min — AB (ref >60.00)
Glucose, Bld: 89 mg/dL (ref 70–99)
Potassium: 4.2 mEq/L (ref 3.5–5.1)
SODIUM: 140 meq/L (ref 135–145)
TOTAL PROTEIN: 6.7 g/dL (ref 6.0–8.3)
Total Bilirubin: 0.9 mg/dL (ref 0.3–1.2)

## 2013-10-16 LAB — SEDIMENTATION RATE: SED RATE: 7 mm/h (ref 0–22)

## 2013-10-16 LAB — TSH: TSH: 0.74 u[IU]/mL (ref 0.35–5.50)

## 2013-10-16 LAB — HIGH SENSITIVITY CRP: CRP, High Sensitivity: 0.51 mg/L (ref 0.000–5.000)

## 2013-10-16 NOTE — Patient Instructions (Signed)
Blood work today. We will refer you to skin doctor for further evaluation - pass by Marion's office or we will call you with appointment.

## 2013-10-16 NOTE — Progress Notes (Signed)
BP 110/80  Pulse 60  Temp(Src) 98 F (36.7 C) (Oral)  Wt 231 lb 12.8 oz (105.144 kg)   CC: persistent skin rash  Subjective:    Patient ID: Alejandro Decker, male    DOB: [birth year], 53 y.o.   MRN: [id number]  HPI: Alejandro Decker is a 53 y.o. male presenting on 10/16/2013 with Follow-up   See prior 2 notes for details. Briefly, red blotchy rash for last 2 months, more severe over last 3 weeks. Itchy skin, rash popped up and stayed. Describes hive like rash  Tried cetirizine, benadryl, cortisone cream.  At last visit 09/25/2013: ?allergic reaction to unknown trigger vs other cause of plaque like rash on extremities - ?lichen planus or pityriasis rosea (although no herald patch).  Regardless, seems to be improving after steroid course. Recommended continue treatment course, continue topical cortisone and prn benadryl.  Also encouraged regular moisturizing with fragrance free cream. No systemic sxs today. If recurring rash, consider basic blood work including CMP, CBC, CRP and ANA and referral to derm vs biopsy. Pt agrees with plan.  Started around christmas - on forearms and chest around nipples then spread to rest of body.  Spared palms, soles and face.  No new meds.  No new lotions, detergents, soap, shampoos. No new foods.  No oral lesions, no fevers/chills, no joint pains or nausea/vomiting, dyspnea, tongue swelling Avoiding fish, shellfish, tomatoes, spicy foods and chocolate, nuts just in case.   Has been using fragrance free aveeno moisturizer and OTC antihistamine.  Patient Active Problem List   Diagnosis Date Noted  . Skin rash 09/17/2013  . Superficial thrombophlebitis of right leg 03/11/2013  . Bilateral serous otitis media 01/14/2013  . Healthcare maintenance 03/14/2012  . HYPERLIPIDEMIA 12/20/2007  . ALLERGY 11/27/2006   Past Medical History  Diagnosis Date  . GERD (gastroesophageal reflux disease)   . Hyperlipemia    Past Surgical History  Procedure Laterality  Date  . Tonsillectomy  7 yoa  . Lasik  11/17/2009    Dr. Gaspar Bidding, Gerlene Burdock, [facility]   History  Substance Use Topics  . Smoking status: Never Smoker   . Smokeless tobacco: Never Used  . Alcohol Use: 3.0 oz/week    6 drink(s) per week     Comment: weekly   Family History  Problem Relation Age of Onset  . Benign prostatic hyperplasia Father   . Diabetes Maternal Grandfather   . Parkinsonism Paternal Grandmother   . Heart disease Maternal Grandfather   . Stroke Maternal Grandmother   . Coronary artery disease Neg Hx   . Cancer Neg Hx      Relevant past medical, surgical, family and social history reviewed and updated as indicated.  Allergies and medications reviewed and updated. Current Outpatient Prescriptions on File Prior to Visit  Medication Sig  . cetirizine (KLS ALLER-TEC) 10 MG tablet Take 10 mg by mouth daily.  . diphenhydrAMINE (BENADRYL) 25 mg capsule Take 50 mg by mouth at bedtime as needed.   . hydrocortisone cream 1 % Apply 1 application topically 2 (two) times daily.   No current facility-administered medications on file prior to visit.    Review of Systems Per HPI unless specifically indicated above    Objective:    BP 110/80  Pulse 60  Temp(Src) 98 F (36.7 C) (Oral)  Wt 231 lb 12.8 oz (105.144 kg)  Physical Exam  Nursing note and vitals reviewed. Constitutional: He appears well-developed and well-nourished. No distress.  HENT:  Mouth/Throat: Oropharynx  is clear and moist. No oropharyngeal exudate.  No oral lesions  Skin: Skin is warm and dry. Rash noted. There is erythema.  Erythematous pruritic maculopapular rash on abd/chest, mild erythema on back, overall decreased erythema since last visit but new patche on legs. Spares face and scalp and palms and soles.       Assessment & Plan:   Problem List Items Addressed This Visit   Skin rash - Primary     Persistent pruritic rash for almost 3 months of unclear etiology - with only minimal  improvement. Will obtain blood work (CBC, CMP, CRP, ESR and ANA) and refer to derm for further evaluation. Pt agrees with plan.    Relevant Orders      Comprehensive metabolic panel      TSH      CBC with Differential      ANA      High sensitivity CRP      Sedimentation rate       Follow up plan: Return if symptoms worsen or fail to improve.

## 2013-10-16 NOTE — Assessment & Plan Note (Signed)
Persistent pruritic rash for almost 3 months of unclear etiology - with only minimal improvement. Will obtain blood work (CBC, CMP, CRP, ESR and ANA) and refer to derm for further evaluation. Pt agrees with plan.

## 2013-10-16 NOTE — Progress Notes (Signed)
Pre visit review using our clinic review tool, if applicable. No additional management support is needed unless otherwise documented below in the visit note. 

## 2013-10-17 LAB — ANA: Anti Nuclear Antibody(ANA): NEGATIVE

## 2013-11-01 ENCOUNTER — Encounter: Payer: Self-pay | Admitting: Family Medicine

## 2013-12-16 DIAGNOSIS — G8929 Other chronic pain: Secondary | ICD-10-CM | POA: Insufficient documentation

## 2014-04-02 ENCOUNTER — Ambulatory Visit (INDEPENDENT_AMBULATORY_CARE_PROVIDER_SITE_OTHER): Payer: 59 | Admitting: Family Medicine

## 2014-04-02 ENCOUNTER — Ambulatory Visit (INDEPENDENT_AMBULATORY_CARE_PROVIDER_SITE_OTHER)
Admission: RE | Admit: 2014-04-02 | Discharge: 2014-04-02 | Disposition: A | Discharge status: Home / Self Care | Payer: 59 | Source: Ambulatory Visit | Attending: Family Medicine | Admitting: Family Medicine

## 2014-04-02 ENCOUNTER — Encounter: Payer: Self-pay | Admitting: Family Medicine

## 2014-04-02 VITALS — BP 128/82 | HR 66 | Temp 98.1°F | Wt 235.0 lb

## 2014-04-02 DIAGNOSIS — R0609 Other forms of dyspnea: Secondary | ICD-10-CM | POA: Insufficient documentation

## 2014-04-02 DIAGNOSIS — R0989 Other specified symptoms and signs involving the circulatory and respiratory systems: Secondary | ICD-10-CM

## 2014-04-02 MED ORDER — ALBUTEROL SULFATE HFA 108 (90 BASE) MCG/ACT IN AERS: 1.0000 | INHALATION_SPRAY | RESPIRATORY_TRACT | Status: DC | PRN

## 2014-04-02 NOTE — Progress Notes (Signed)
Pre visit review using our clinic review tool, if applicable. No additional management support is needed unless otherwise documented below in the visit note. 

## 2014-04-02 NOTE — Patient Instructions (Signed)
Xray today It does sound like exercise induced bronchospasm or some exercise induced asthma Try albuterol inhaler 1 puff 15 min prior to exercise to see if improvement noted. Let me know if new symptoms or not improving with this.  Exercise-Induced Bronchospasm A bronchospasm is a condition that is commonly caused by exercise, in which the muscles around the bronchioles (airways to the lungs) tighten, causing the airway to constrict. Exercise-induced bronchospasms are usually associated with short periods of vigorous activity. Many people who experience an exercise-induced bronchospasm may not notice at the time of the event; however, the athlete may later experience symptoms that negatively affect training and performance. SYMPTOMS   High-pitched sounds with breathing (wheezing).  Coughing.  Increased work of breathing (dyspnea).  Rapid breathing (hyperventilation).  Chest pain.  Symptoms occurring 4 to 6 hours after exercise is completed (late-phase reaction). CAUSES  It is not known why certain individuals experience bronchospasms. Respiratory specialists currently think that the cool or dry air breathed in may cause damage to the lining of the bronchioles, which elicits an inflammatory response. The inflammation causes the airways to narrow and symptoms then occur. RISK INCREASES WITH:  Viral infections.  Exercise in cold air.  Exercise in dry conditions.  Poor physical fitness.  High-intensity exercise.  No warm-up before play.  Frequent exposure to substances that produce allergic reactions (allergens). PREVENTION   Improve conditioning.  Treat allergies.  Breathe warm air (cover mouth and nose with a towel or scarf).  Warm up for an appropriate period of time before physical activity.  Gradually decrease intensity (warm down) for an appropriate period of time after physical activity. PROGNOSIS  Most people with exercise-induced asthma respond well to  medication. Patients are typically prescribed an inhaler to treat bronchospasms. However, if symptoms persist despite treatment and continue to affect performance, individuals may need to consider avoiding activities that produce symptoms. RELATED COMPLICATIONS   Decreased athletic performance.  Inability to condition as well as expected.  Side effects from medications. TREATMENT   Maintain physical fitness.  Run with a scarf or towel over your mouth in cold, dry air.  Complete at least 10 minutes of warm-up before high-intensity exercise.  Warm down after play.  Treat allergies. MEDICATION  The usual initial medication is an albuterol inhaler, which expands the constricted bronchioles.  The second-line medication is inhaled corticosteroids, which reduce inflammation in the airway.  Alternative medications included sodium cromoglycate and nedocromil inhalers.  Long-acting medications such as salmeterol can also be used as second-line medications. ACTIVITY  If medications are able to treat the offending symptoms, then no activity modification is required. If you know you will be training or competing in cold or dry climates take extra precautions to prevent symptoms. DIET  No specific diet is recommended.  SEEK MEDICAL CARE IF:   Greater than normal fatigue with exercise.  Greater than normal difficulty breathing occurs with exercise.  Increased wheezing with exercise.  You appear to be breathing harder and faster than expected with training.  Allergies appear to be uncontrollable.  You experience chest pain with exercise. Document Released: 07/24/2005 Document Revised: 12/08/2013 Document Reviewed: 11/05/2008 [facility] Patient Information 2015 [place], [place]. This information is not intended to replace advice given to you by your health care provider. Make sure you discuss any questions you have with your health care provider.

## 2014-04-02 NOTE — Assessment & Plan Note (Signed)
Doubt cardiac cause given description today. Anticipate more exercise induced bronchospasm in h/o allergic rhinitis - discussed this. Discussed things to help prevent this including avoiding cold environment for exercise, warming up appropriately. Provided with pt education handout on EIB. Trial of albuterol inhaler 1 puff 15 min prior to exercise. Story also not consistent with sleep apnea.

## 2014-04-02 NOTE — Progress Notes (Signed)
BP 128/82  Pulse 66  Temp(Src) 98.1 F (36.7 C) (Oral)  Wt 235 lb (106.595 kg)  SpO2 95%   CC: discuss asthma?  Subjective:    Patient ID: Alejandro Decker, male    DOB: 1960/12/15, 53 y.o.   MRN: [id number]  HPI: Alejandro Decker is a 53 y.o. male presenting on 04/02/2014 for Asthma   Noting occasional dyspnea with exertion over last 1.5 months. Noticing some wheezing with this as well as some tightness in chest. Not improved with rest. No nausea or diaphoresis with this. Tends to get better towards end of workout, worse at beginning of workout.    No fevers/chills, no cough with any of this, no congestion.  Wife hsa been complaining over the last year about his snoring.  Feels gets restorative slep. No apneic episodes. No daytime sleepiness.. Wife with mild asthma. Pt used wife's inhaler which significantly helped him sleep one night. He also tried puff of albuterol inhaler prior to exercise and it seemed to help.    No h/o asthma. Brother with h/o asthma.  Never smoker. Does work at tobacco factory around smokers.  No significant fmhx CAD. H/o allergic rhinitis - seasonal. Controlled with claritin and flonase.  Relevant past medical, surgical, family and social history reviewed and updated as indicated.  Allergies and medications reviewed and updated. Current Outpatient Prescriptions on File Prior to Visit  Medication Sig  . cetirizine (KLS ALLER-TEC) 10 MG tablet Take 10 mg by mouth daily.  . diphenhydrAMINE (BENADRYL) 25 mg capsule Take 50 mg by mouth at bedtime as needed.   . hydrocortisone cream 1 % Apply 1 application topically 2 (two) times daily.  . Pramoxine-Calamine (AVEENO ANTI-ITCH EX) Apply topically as directed.   No current facility-administered medications on file prior to visit.    Review of Systems Per HPI unless specifically indicated above    Objective:    BP 128/82  Pulse 66  Temp(Src) 98.1 F (36.7 C) (Oral)  Wt 235 lb (106.595 kg)  SpO2 95%    Physical Exam  Nursing note and vitals reviewed. Constitutional: He appears well-developed and well-nourished. No distress.  muscular  HENT:  Mouth/Throat: Oropharynx is clear and moist. No oropharyngeal exudate.  Cardiovascular: Normal rate, regular rhythm, normal heart sounds and intact distal pulses.   No murmur heard. Pulmonary/Chest: Effort normal and breath sounds normal. No respiratory distress. He has no wheezes. He has no rales.  ?mild bronchospasm noted at end forced expiration  Psychiatric: He has a normal mood and affect.   Results for orders placed in visit on 10/16/13  COMPREHENSIVE METABOLIC PANEL      Result Value Ref Range   Sodium 140  135 - 145 mEq/L   Potassium 4.2  3.5 - 5.1 mEq/L   Chloride 106  96 - 112 mEq/L   CO2 25  19 - 32 mEq/L   Glucose, Bld 89  70 - 99 mg/dL   BUN 22  6 - 23 mg/dL   Creatinine, Ser 1.4  0.4 - 1.5 mg/dL   Total Bilirubin 0.9  0.3 - 1.2 mg/dL   Alkaline Phosphatase 65  39 - 117 U/L   AST 46 (*) 0 - 37 U/L   ALT 35  0 - 53 U/L   Total Protein 6.7  6.0 - 8.3 g/dL   Albumin 4.3  3.5 - 5.2 g/dL   Calcium 9.3  8.4 - 10.5 mg/dL   GFR 57.13 (*) >60.00 mL/min  TSH  Result Value Ref Range   TSH 0.74  0.35 - 5.50 uIU/mL  CBC WITH DIFFERENTIAL      Result Value Ref Range   WBC 6.6  4.5 - 10.5 K/uL   RBC 4.71  4.22 - 5.81 Mil/uL   Hemoglobin 14.9  13.0 - 17.0 g/dL   HCT 43.5  39.0 - 52.0 %   MCV 92.4  78.0 - 100.0 fl   MCHC 34.3  30.0 - 36.0 g/dL   RDW 12.6  11.5 - 14.6 %   Platelets 266.0  150.0 - 400.0 K/uL   Neutrophils Relative % 56.7  43.0 - 77.0 %   Lymphocytes Relative 30.8  12.0 - 46.0 %   Monocytes Relative 5.8  3.0 - 12.0 %   Eosinophils Relative 6.0 (*) 0.0 - 5.0 %   Basophils Relative 0.7  0.0 - 3.0 %   Neutro Abs 3.8  1.4 - 7.7 K/uL   Lymphs Abs 2.0  0.7 - 4.0 K/uL   Monocytes Absolute 0.4  0.1 - 1.0 K/uL   Eosinophils Absolute 0.4  0.0 - 0.7 K/uL   Basophils Absolute 0.0  0.0 - 0.1 K/uL  ANA      Result Value  Ref Range   ANA NEG  NEGATIVE  HIGH SENSITIVITY CRP      Result Value Ref Range   CRP, High Sensitivity 0.510  0.000 - 5.000 mg/L  SEDIMENTATION RATE      Result Value Ref Range   Sed Rate 7  0 - 22 mm/hr      Assessment & Plan:   Problem List Items Addressed This Visit   Dyspnea on exertion - Primary     Doubt cardiac cause given description today. Anticipate more exercise induced bronchospasm in h/o allergic rhinitis - discussed this. Discussed things to help prevent this including avoiding cold environment for exercise, warming up appropriately. Provided with pt education handout on EIB. Trial of albuterol inhaler 1 puff 15 min prior to exercise. Story also not consistent with sleep apnea.    Relevant Medications      albuterol (PROVENTIL HFA;VENTOLIN HFA) 108 (90 BASE) MCG/ACT inhaler   Other Relevant Orders      DG Chest 2 View       Follow up plan: Return if symptoms worsen or fail to improve.

## 2014-04-03 ENCOUNTER — Other Ambulatory Visit: Payer: Self-pay | Admitting: Family Medicine

## 2014-04-03 DIAGNOSIS — R9389 Abnormal findings on diagnostic imaging of other specified body structures: Secondary | ICD-10-CM

## 2014-06-08 ENCOUNTER — Ambulatory Visit (INDEPENDENT_AMBULATORY_CARE_PROVIDER_SITE_OTHER)
Admission: RE | Admit: 2014-06-08 | Discharge: 2014-06-08 | Disposition: A | Discharge status: Home / Self Care | Payer: 59 | Source: Ambulatory Visit | Attending: Family Medicine | Admitting: Family Medicine

## 2014-06-08 DIAGNOSIS — R9389 Abnormal findings on diagnostic imaging of other specified body structures: Secondary | ICD-10-CM

## 2014-06-08 DIAGNOSIS — R938 Abnormal findings on diagnostic imaging of other specified body structures: Secondary | ICD-10-CM

## 2014-08-29 ENCOUNTER — Other Ambulatory Visit: Payer: Self-pay | Admitting: Family Medicine

## 2014-08-29 DIAGNOSIS — E785 Hyperlipidemia, unspecified: Secondary | ICD-10-CM

## 2014-09-01 ENCOUNTER — Other Ambulatory Visit (INDEPENDENT_AMBULATORY_CARE_PROVIDER_SITE_OTHER): Payer: 59

## 2014-09-01 DIAGNOSIS — E785 Hyperlipidemia, unspecified: Secondary | ICD-10-CM

## 2014-09-01 LAB — COMPREHENSIVE METABOLIC PANEL
ALBUMIN: 4.1 g/dL (ref 3.5–5.2)
ALT: 28 U/L (ref 0–53)
AST: 24 U/L (ref 0–37)
Alkaline Phosphatase: 59 U/L (ref 39–117)
BUN: 20 mg/dL (ref 6–23)
CALCIUM: 9.2 mg/dL (ref 8.4–10.5)
CO2: 25 mEq/L (ref 19–32)
CREATININE: 1.25 mg/dL (ref 0.40–1.50)
Chloride: 111 mEq/L (ref 96–112)
GFR: 64.36 mL/min (ref >60.00)
Glucose, Bld: 104 mg/dL — ABNORMAL HIGH (ref 70–99)
Potassium: 4.6 mEq/L (ref 3.5–5.1)
Sodium: 144 mEq/L (ref 135–145)
Total Bilirubin: 0.4 mg/dL (ref 0.2–1.2)
Total Protein: 6.5 g/dL (ref 6.0–8.3)

## 2014-09-01 LAB — LIPID PANEL
CHOL/HDL RATIO: 3
CHOLESTEROL: 191 mg/dL (ref 0–200)
HDL: 63 mg/dL (ref >39.00)
LDL CALC: 111 mg/dL — AB (ref 0–99)
NonHDL: 128
TRIGLYCERIDES: 83 mg/dL (ref 0.0–149.0)
VLDL: 16.6 mg/dL (ref 0.0–40.0)

## 2014-09-07 ENCOUNTER — Encounter: Payer: Self-pay | Admitting: Family Medicine

## 2014-09-07 ENCOUNTER — Ambulatory Visit (INDEPENDENT_AMBULATORY_CARE_PROVIDER_SITE_OTHER): Payer: 59 | Admitting: Family Medicine

## 2014-09-07 VITALS — BP 124/84 | HR 60 | Temp 98.4°F | Ht 70.0 in | Wt 241.0 lb

## 2014-09-07 DIAGNOSIS — E785 Hyperlipidemia, unspecified: Secondary | ICD-10-CM

## 2014-09-07 DIAGNOSIS — E669 Obesity, unspecified: Secondary | ICD-10-CM

## 2014-09-07 DIAGNOSIS — Z0000 Encounter for general adult medical examination without abnormal findings: Secondary | ICD-10-CM

## 2014-09-07 DIAGNOSIS — Z1211 Encounter for screening for malignant neoplasm of colon: Secondary | ICD-10-CM

## 2014-09-07 DIAGNOSIS — Z125 Encounter for screening for malignant neoplasm of prostate: Secondary | ICD-10-CM

## 2014-09-07 NOTE — Assessment & Plan Note (Signed)
Reviewed healthy diet and lifestyle changes to affect weight loss. Body mass index is 34.58 kg/(m^2).

## 2014-09-07 NOTE — Assessment & Plan Note (Signed)
Reviewed #s - stable off meds.

## 2014-09-07 NOTE — Assessment & Plan Note (Signed)
Preventative protocols reviewed and updated unless pt declined. Discussed healthy diet and lifestyle.  

## 2014-09-07 NOTE — Patient Instructions (Addendum)
Pass by lab to pick up stool kit and for prostate blood test. Return as needed or in 1 year for next physical. Increase water intake.

## 2014-09-07 NOTE — Progress Notes (Signed)
Pre visit review using our clinic review tool, if applicable. No additional management support is needed unless otherwise documented below in the visit note. 

## 2014-09-07 NOTE — Progress Notes (Signed)
BP 124/84 mmHg  Pulse 60  Temp(Src) 98.4 F (36.9 C) (Oral)  Ht 5' 10"  (1.778 m)  Wt 241 lb (109.317 kg)  BMI 34.58 kg/m2   CC: CPE  Subjective:    Patient ID: Alejandro Decker, male    DOB: 1961-01-12, 54 y.o.   MRN: [id number]  HPI: Alejandro Decker is a 54 y.o. male presenting on 09/07/2014 for Annual Exam   Preventative: Colon cancer screening - discussed, pt requests stool kit Prostate cancer screening - discussed, would like screening. No fmhx colon or prostate cancer. Nocturia x1, strong stream. No BM changes, no blood in stool noted.  Flu 06/2014 Last tetanus 2010 Seat belt use discussed Sunscreen use discussed. 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 Interim medical history since our last visit reviewed. Allergies and medications reviewed and updated. Current Outpatient Prescriptions on File Prior to Visit  Medication Sig  . cetirizine (KLS ALLER-TEC) 10 MG tablet Take 10 mg by mouth daily.  . fluticasone (FLONASE) 50 MCG/ACT nasal spray Place 2 sprays into both nostrils daily.  . diphenhydrAMINE (BENADRYL) 25 mg capsule Take 50 mg by mouth at bedtime as needed.   . hydrocortisone cream 1 % Apply 1 application topically 2 (two) times daily.  . Pramoxine-Calamine (AVEENO ANTI-ITCH EX) Apply topically as directed.   No current facility-administered medications on file prior to visit.    Review of Systems  Constitutional: Negative for fever, chills, activity change, appetite change, fatigue and unexpected weight change.  HENT: Negative for hearing loss.   Eyes: Negative for visual disturbance.  Respiratory: Negative for cough, chest tightness, shortness of breath and wheezing.     Cardiovascular: Negative for chest pain, palpitations and leg swelling.  Gastrointestinal: Negative for nausea, vomiting, abdominal pain, diarrhea, constipation, blood in stool and abdominal distention.  Genitourinary: Negative for hematuria and difficulty urinating.  Musculoskeletal: Negative for myalgias, arthralgias and neck pain.  Skin: Negative for rash.  Neurological: Negative for dizziness, seizures, syncope and headaches.  Hematological: Negative for adenopathy. Does not bruise/bleed easily.  Psychiatric/Behavioral: Negative for dysphoric mood. The patient is not nervous/anxious.    Per HPI unless specifically indicated above     Objective:    BP 124/84 mmHg  Pulse 60  Temp(Src) 98.4 F (36.9 C) (Oral)  Ht 5' 10"  (1.778 m)  Wt 241 lb (109.317 kg)  BMI 34.58 kg/m2  Wt Readings from Last 3 Encounters:  09/07/14 241 lb (109.317 kg)  04/02/14 235 lb (106.595 kg)  10/16/13 231 lb 12.8 oz (105.144 kg)    Physical Exam  Constitutional: He is oriented to person, place, and time. He appears well-developed and well-nourished. No distress.  HENT:  Head: Normocephalic and atraumatic.  Right Ear: Hearing, tympanic membrane, external ear and ear canal normal.  Left Ear: Hearing, tympanic membrane, external ear and ear canal normal.  Nose: Nose normal.  Mouth/Throat: Uvula is midline, oropharynx is clear and moist and mucous membranes are normal. No oropharyngeal exudate, posterior oropharyngeal edema or posterior oropharyngeal erythema.  Eyes: Conjunctivae and EOM are normal. Pupils are equal, round, and reactive to light. No scleral icterus.  Neck: Normal range of motion. Neck supple. No thyromegaly present.  Cardiovascular: Normal rate, regular rhythm, normal heart sounds and  intact distal pulses.   No murmur heard. Pulses:      Radial pulses are 2+ on the right side, and 2+ on the left side.  Pulmonary/Chest: Effort normal and breath sounds normal. No respiratory distress. He  has no wheezes. He has no rales.  Abdominal: Soft. Bowel sounds are normal. He exhibits no distension and no mass. There is no tenderness. There is no rebound and no guarding.  Genitourinary: Prostate normal. Rectal exam shows external hemorrhoid (noninflamed). Rectal exam shows no internal hemorrhoid, no fissure, no mass, no tenderness and anal tone normal. Prostate is not enlarged (20gm) and not tender.  Musculoskeletal: Normal range of motion. He exhibits no edema.  Lymphadenopathy:    He has no cervical adenopathy.  Neurological: He is alert and oriented to person, place, and time.  CN grossly intact, station and gait intact  Skin: Skin is warm and dry. No rash noted.  Psychiatric: He has a normal mood and affect. His behavior is normal. Judgment and thought content normal.  Nursing note and vitals reviewed.  Results for orders placed or performed in visit on 09/01/14  Comprehensive metabolic panel  Result Value Ref Range   Sodium 144 135 - 145 mEq/L   Potassium 4.6 3.5 - 5.1 mEq/L   Chloride 111 96 - 112 mEq/L   CO2 25 19 - 32 mEq/L   Glucose, Bld 104 (H) 70 - 99 mg/dL   BUN 20 6 - 23 mg/dL   Creatinine, Ser 1.25 0.40 - 1.50 mg/dL   Total Bilirubin 0.4 0.2 - 1.2 mg/dL   Alkaline Phosphatase 59 39 - 117 U/L   AST 24 0 - 37 U/L   ALT 28 0 - 53 U/L   Total Protein 6.5 6.0 - 8.3 g/dL   Albumin 4.1 3.5 - 5.2 g/dL   Calcium 9.2 8.4 - 10.5 mg/dL   GFR 64.36 >60.00 mL/min  Lipid panel  Result Value Ref Range   Cholesterol 191 0 - 200 mg/dL   Triglycerides 83.0 0.0 - 149.0 mg/dL   HDL 63.00 >39.00 mg/dL   VLDL 16.6 0.0 - 40.0 mg/dL   LDL Cholesterol 111 (H) 0 - 99 mg/dL   Total CHOL/HDL Ratio 3    NonHDL 128.00       Assessment & Plan:   Problem List Items Addressed This Visit    Obesity    Reviewed healthy diet and lifestyle changes to affect weight loss. Body mass index is 34.58 kg/(m^2).       HLD (hyperlipidemia)    Reviewed #s - stable off meds.      Healthcare  maintenance - Primary    Preventative protocols reviewed and updated unless pt declined. Discussed healthy diet and lifestyle.       Other Visit Diagnoses    Special screening for malignant neoplasm of prostate        Relevant Orders    PSA    Colon cancer screening        Relevant Orders    Fecal occult blood, imunochemical        Follow up plan: Return in about 1 year (around 09/08/2015), or as needed, for annual exam, prior fasting for blood work.

## 2014-09-08 LAB — PSA: PSA: 0.48 ng/mL (ref 0.10–4.00)

## 2014-09-09 ENCOUNTER — Encounter: Payer: Self-pay | Admitting: *Deleted

## 2014-09-30 ENCOUNTER — Other Ambulatory Visit: Payer: 59

## 2014-09-30 DIAGNOSIS — Z1211 Encounter for screening for malignant neoplasm of colon: Secondary | ICD-10-CM

## 2014-09-30 LAB — FECAL OCCULT BLOOD, GUAIAC: Fecal Occult Blood: NEGATIVE

## 2014-10-02 LAB — FECAL OCCULT BLOOD, IMMUNOCHEMICAL: Fecal Occult Bld: NEGATIVE

## 2014-10-05 ENCOUNTER — Encounter: Payer: Self-pay | Admitting: *Deleted

## 2015-04-28 ENCOUNTER — Telehealth: Payer: Self-pay | Admitting: Family Medicine

## 2015-04-28 NOTE — Telephone Encounter (Signed)
Patient Name: Alejandro Decker DOB: Jul 05, 1961 Initial Comment Caller states he has blood in his stool Nurse Assessment Nurse: Vallery Sa, RN, Cathy Date/Time (Eastern Time): 04/28/2015 2:31:02 PM Confirm and document reason for call. If symptomatic, describe symptoms. ---Caller states he has had blood in his stool the past 3 days. No fever. No known injury in the past 3 days. Has the patient traveled out of the country within the last 30 days? ---No Does the patient require triage? ---Yes Related visit to physician within the last 2 weeks? ---No Does the PT have any chronic conditions? (i.e. diabetes, asthma, etc.) ---No Guidelines Guideline Title Affirmed Question Affirmed Notes Rectal Bleeding MODERATE rectal bleeding (small blood clots, passing blood without stool, or toilet water turns red) Final Disposition User See Physician within [place], RN, Tye [place] Comments Scheduled for 12pm 04/29/15 with Dr. Elsie Stain Referrals REFERRED TO PCP OFFICE Disagree/Comply: Comply

## 2015-04-28 NOTE — Telephone Encounter (Signed)
Pt has appt 04/29/15 at 12 noon with Dr Damita Dunnings by [facility].

## 2015-04-29 ENCOUNTER — Encounter: Payer: Self-pay | Admitting: Family Medicine

## 2015-04-29 ENCOUNTER — Ambulatory Visit (INDEPENDENT_AMBULATORY_CARE_PROVIDER_SITE_OTHER): Payer: 59 | Admitting: Family Medicine

## 2015-04-29 VITALS — BP 124/74 | HR 84 | Temp 97.9°F | Wt 239.0 lb

## 2015-04-29 DIAGNOSIS — K625 Hemorrhage of anus and rectum: Secondary | ICD-10-CM

## 2015-04-29 MED ORDER — HYDROCORTISONE ACETATE 25 MG RE SUPP: 25.0000 mg | Freq: Two times a day (BID) | RECTAL | Status: DC

## 2015-04-29 NOTE — Progress Notes (Signed)
Pre visit review using our clinic review tool, if applicable. No additional management support is needed unless otherwise documented below in the visit note.  BRBPR.  Blood with stools, small amount.  Not consistent, not a lot per patient.  First noted about 4 days ago, intermittently in the meantime, some BMs with and some without blood.  H/o hemorrhoid irritation in the past but not currently.  No black stools.  No BRBPR today.  No FCNAVD.  He feels well o/w.  Prev with IFOBs neg, no FH colon cancer known.  No abd pain.  Not lightheaded on standing.   Meds, vitals, and allergies reviewed.   ROS: See HPI.  Otherwise, noncontributory.  nad ncat rrr ctab Rectal exam w/o gross blood.  Old ext hemorrhoid noted, not inflamed or bleeding.  No fissure seen.

## 2015-04-29 NOTE — Patient Instructions (Signed)
Use the suppositories in the meantime and I'll check with G.  Take care.  Glad to see you.

## 2015-04-30 DIAGNOSIS — K625 Hemorrhage of anus and rectum: Secondary | ICD-10-CM | POA: Insufficient documentation

## 2015-04-30 NOTE — Assessment & Plan Note (Signed)
He is a weight lifter. Likely with int hemorrhoids.   D/w pt re: ddx.  No sign of ominous dx.   Would be reasonable to use suppositories for now.  If sx not resolved, we can refer over to GI.  Assuming sx resolve, I would be okay with repeat IFOB later on.   I will ask for PCP input on this, re: his preferences re: repeat IFOB vs GI referral, assuming his sx resolve We can contact patient thereafter.  Patient agrees with plan.

## 2015-05-03 ENCOUNTER — Telehealth: Payer: Self-pay | Admitting: Family Medicine

## 2015-05-03 NOTE — Telephone Encounter (Signed)
Notify pt.   I talked with Dr. Darnell Level.  He leans toward seeing if his sx resolve with the suppositories and if persistent bleed then refer to GI.  Dr. Darnell Level raises the point that if his sx resolve, there is likely no need for repeat iFOB this year given his overall low risk of other problems.   He could get another IFOB done next year as part of routine screening.  Please call pt, see how he is doing and update me as needed.  Thanks.

## 2015-05-04 NOTE — Telephone Encounter (Signed)
Notified patient. He states that his symptoms have since subsided, and he has not had to use the medications. Patient states that he is feeling good as well, and that he would call if he had any questions or concerns. Thanks!

## 2015-05-08 DIAGNOSIS — I4510 Unspecified right bundle-branch block: Secondary | ICD-10-CM

## 2015-05-08 HISTORY — PX: CT CTA CORONARY W/CA SCORE W/CM &/OR WO/CM: HXRAD787

## 2015-05-08 HISTORY — DX: Unspecified right bundle-branch block: I45.10

## 2015-05-08 HISTORY — PX: CARDIOVASCULAR STRESS TEST: SHX262

## 2015-05-08 HISTORY — PX: US ECHOCARDIOGRAPHY: HXRAD669

## 2015-05-11 ENCOUNTER — Encounter: Payer: Self-pay | Admitting: Family Medicine

## 2015-05-11 ENCOUNTER — Ambulatory Visit (INDEPENDENT_AMBULATORY_CARE_PROVIDER_SITE_OTHER): Payer: 59 | Admitting: Family Medicine

## 2015-05-11 VITALS — BP 122/82 | HR 64 | Temp 98.0°F | Wt 234.5 lb

## 2015-05-11 DIAGNOSIS — R0609 Other forms of dyspnea: Secondary | ICD-10-CM

## 2015-05-11 NOTE — Progress Notes (Signed)
Pre visit review using our clinic review tool, if applicable. No additional management support is needed unless otherwise documented below in the visit note. 

## 2015-05-11 NOTE — Progress Notes (Signed)
BP 122/82 mmHg  Pulse 64  Temp(Src) 98 F (36.7 C) (Oral)  Wt 234 lb 8 oz (106.369 kg)  SpO2 97%   CC: dyspnea on exertion  Subjective:    Patient ID: Alejandro Decker, male    DOB: 07-30-1961, 54 y.o.   MRN: [id number]  HPI: Alejandro Decker is a 54 y.o. male presenting on 05/11/2015 for Dyspnea on exhertion   Seasonal allergies worse spring and fall treated with antihistamines (zyrtec, benadryl) and intranasal steroid.   Noticing intermittent episodes of exertional dyspnea ongoing for last year. Started 03/2014, albuterol didn't help.   Noticing dyspnea with mild exertion (100 yards, 1 flight of stairs).   Some chest tightness with deep breath, but no pain. Some night time wheezing with exhalation.  No current allergy symptoms. No headache, dizziness, palpitations. No anxiety. Stressful job, usually exercises to relieve stress.   Stays active at gym - has noticed decreased stamina/conditioning. Mild nausea at end of cardio session, only 1/2 of what he normally does. Tachycardia quicker than prior with exercise - 140s. Prior thought ?exercise induced bronchospasm - last year treated with albuterol inhaler. Didn't significantly help. Symptoms usually resolve after a few months, this time more persistent. Symptoms present August-Sept 2015, Feb-March 2016, again Aug 2016-present.   No h/o asthma. Brother with h/o asthma.  Never smoker. Does work at tobacco factory around smokers.  No significant fmhx CAD. Maternal grandfather with CHF.  Relevant past medical, surgical, family and social history reviewed and updated as indicated. Interim medical history since our last visit reviewed. Allergies and medications reviewed and updated. Current Outpatient Prescriptions on File Prior to Visit  Medication Sig  . cetirizine (KLS ALLER-TEC) 10 MG tablet Take 10 mg by mouth daily.  . diphenhydrAMINE (BENADRYL) 25 mg capsule Take 50 mg by mouth at bedtime as needed.   . fluticasone (FLONASE) 50  MCG/ACT nasal spray Place 2 sprays into both nostrils daily.   No current facility-administered medications on file prior to visit.    Review of Systems Per HPI unless specifically indicated above     Objective:    BP 122/82 mmHg  Pulse 64  Temp(Src) 98 F (36.7 C) (Oral)  Wt 234 lb 8 oz (106.369 kg)  SpO2 97%  Wt Readings from Last 3 Encounters:  05/11/15 234 lb 8 oz (106.369 kg)  04/29/15 239 lb (108.41 kg)  09/07/14 241 lb (109.317 kg)    Physical Exam  Constitutional: He appears well-developed and well-nourished. No distress.  HENT:  Mouth/Throat: Oropharynx is clear and moist. No oropharyngeal exudate.  Eyes: Conjunctivae and EOM are normal. Pupils are equal, round, and reactive to light. No scleral icterus.  Neck: Normal range of motion. Neck supple.  Cardiovascular: Normal rate, regular rhythm, normal heart sounds and intact distal pulses.   No murmur heard. Pulmonary/Chest: Effort normal and breath sounds normal. No respiratory distress. He has no wheezes. He has no rales.  No wheezing. Mild bronchospasm with forced expiration  Musculoskeletal: He exhibits no edema.  Skin: Skin is warm and dry. No rash noted.  Psychiatric: He has a normal mood and affect.  Nursing note and vitals reviewed.      Assessment & Plan:   Problem List Items Addressed This Visit    Dyspnea on exertion - Primary    Very active 54 yo with persistent dyspnea on exertion despite resolution in allergy symptoms. Activity limiting symptoms. Initially thought exercise induced bronchospasm/asthma, but sxs did not improve at all with albuterol.  Overall low cardiac risk. However, given persistent nature of symptoms and association with nausea, mild chest tightness with exhalation, I did suggest cardiology eval for discussion of possible stress test to r/o cardiac etiology.  Ambulatory pulse ox today ok. If normal eval by card, consider referral to pulm. Pt agrees with plan.  EKG - NSR rate 60s,  normal axis, intervals, no acute ST/T changes, nonspecific T wave flattening, RSR V1, no old to compare      Relevant Orders   EKG 12-Lead (Completed)   Ambulatory referral to Cardiology       Follow up plan: No Follow-up on file.

## 2015-05-11 NOTE — Patient Instructions (Signed)
Ambulatory pulse ox today. I would like to refer you to cardiologist for further evaluation. Pass by [facility]'s office to get this set up.

## 2015-05-11 NOTE — Assessment & Plan Note (Addendum)
Very active 54 yo with persistent dyspnea on exertion despite resolution in allergy symptoms. Activity limiting symptoms. Initially thought exercise induced bronchospasm/asthma, but sxs did not improve at all with albuterol. Overall low cardiac risk. However, given persistent nature of symptoms and association with nausea, mild chest tightness with exhalation, I did suggest cardiology eval for discussion of possible stress test to r/o cardiac etiology.  Ambulatory pulse ox today ok. If normal eval by card, consider referral to pulm. Pt agrees with plan.  EKG - NSR rate 60s, normal axis, intervals, no acute ST/T changes, nonspecific T wave flattening, RSR V1, no old to compare

## 2015-05-23 ENCOUNTER — Encounter: Payer: Self-pay | Admitting: Family Medicine

## 2015-05-29 ENCOUNTER — Encounter: Payer: Self-pay | Admitting: Family Medicine

## 2015-07-26 ENCOUNTER — Telehealth: Payer: Self-pay | Admitting: Family Medicine

## 2015-07-26 NOTE — Telephone Encounter (Signed)
No. Unfortunately they are not part of [facility], so we cannot do their labs. He will have to go to their office or to a free standing lab for the blood draw.

## 2015-07-26 NOTE — Telephone Encounter (Signed)
Pt called in wanting labs, pt thinks that they may be for cholesterol. Is it ok to put in the labs in? [facility] is the one requesting the lab. Dr Nadyne Coombes is the dr there  cb for pt is [phone]

## 2015-08-04 NOTE — Telephone Encounter (Signed)
L/m per hippa with details mf

## 2015-08-16 ENCOUNTER — Encounter: Payer: Self-pay | Admitting: Family Medicine

## 2016-02-16 ENCOUNTER — Other Ambulatory Visit: Payer: Self-pay | Admitting: Family Medicine

## 2016-02-16 DIAGNOSIS — E785 Hyperlipidemia, unspecified: Secondary | ICD-10-CM

## 2016-02-16 DIAGNOSIS — Z1159 Encounter for screening for other viral diseases: Secondary | ICD-10-CM

## 2016-02-16 DIAGNOSIS — Z125 Encounter for screening for malignant neoplasm of prostate: Secondary | ICD-10-CM

## 2016-02-18 ENCOUNTER — Other Ambulatory Visit: Payer: Self-pay | Admitting: Family Medicine

## 2016-02-18 ENCOUNTER — Other Ambulatory Visit (INDEPENDENT_AMBULATORY_CARE_PROVIDER_SITE_OTHER): Payer: 59

## 2016-02-18 DIAGNOSIS — Z125 Encounter for screening for malignant neoplasm of prostate: Secondary | ICD-10-CM

## 2016-02-18 DIAGNOSIS — E785 Hyperlipidemia, unspecified: Secondary | ICD-10-CM

## 2016-02-18 DIAGNOSIS — Z1159 Encounter for screening for other viral diseases: Secondary | ICD-10-CM

## 2016-02-18 LAB — COMPREHENSIVE METABOLIC PANEL
ALT: 38 U/L (ref 0–53)
AST: 30 U/L (ref 0–37)
Albumin: 4.2 g/dL (ref 3.5–5.2)
Alkaline Phosphatase: 59 U/L (ref 39–117)
BILIRUBIN TOTAL: 0.6 mg/dL (ref 0.2–1.2)
BUN: 22 mg/dL (ref 6–23)
CHLORIDE: 107 meq/L (ref 96–112)
CO2: 27 meq/L (ref 19–32)
CREATININE: 1.34 mg/dL (ref 0.40–1.50)
Calcium: 9.2 mg/dL (ref 8.4–10.5)
GFR: 59.07 mL/min — ABNORMAL LOW (ref >60.00)
GLUCOSE: 88 mg/dL (ref 70–99)
Potassium: 4.9 mEq/L (ref 3.5–5.1)
SODIUM: 142 meq/L (ref 135–145)
Total Protein: 6.4 g/dL (ref 6.0–8.3)

## 2016-02-18 LAB — LIPID PANEL
CHOL/HDL RATIO: 3
Cholesterol: 199 mg/dL (ref 0–200)
HDL: 64.2 mg/dL (ref >39.00)
LDL CALC: 118 mg/dL — AB (ref 0–99)
NonHDL: 134.35
TRIGLYCERIDES: 80 mg/dL (ref 0.0–149.0)
VLDL: 16 mg/dL (ref 0.0–40.0)

## 2016-02-18 LAB — PSA: PSA: 0.86 ng/mL (ref 0.10–4.00)

## 2016-02-19 LAB — HEPATITIS C ANTIBODY: HCV Ab: NEGATIVE

## 2016-02-23 LAB — TESTOSTERONE: Testosterone: 386 ng/dL (ref 250–827)

## 2016-02-24 ENCOUNTER — Encounter: Payer: 59 | Admitting: Family Medicine

## 2016-03-06 ENCOUNTER — Ambulatory Visit (INDEPENDENT_AMBULATORY_CARE_PROVIDER_SITE_OTHER): Payer: 59 | Admitting: Family Medicine

## 2016-03-06 ENCOUNTER — Encounter: Payer: Self-pay | Admitting: Family Medicine

## 2016-03-06 VITALS — BP 110/80 | HR 60 | Temp 98.3°F | Ht 70.0 in | Wt 247.2 lb

## 2016-03-06 DIAGNOSIS — R0609 Other forms of dyspnea: Secondary | ICD-10-CM

## 2016-03-06 DIAGNOSIS — Z1211 Encounter for screening for malignant neoplasm of colon: Secondary | ICD-10-CM | POA: Diagnosis not present

## 2016-03-06 DIAGNOSIS — J309 Allergic rhinitis, unspecified: Secondary | ICD-10-CM

## 2016-03-06 DIAGNOSIS — Z0000 Encounter for general adult medical examination without abnormal findings: Secondary | ICD-10-CM

## 2016-03-06 DIAGNOSIS — E785 Hyperlipidemia, unspecified: Secondary | ICD-10-CM | POA: Diagnosis not present

## 2016-03-06 DIAGNOSIS — E669 Obesity, unspecified: Secondary | ICD-10-CM

## 2016-03-06 NOTE — Assessment & Plan Note (Addendum)
Discussed healthy diet had lifestyle changes to affect sustainable weight loss. Discussed noted weight gain as well as diet changes for weight loss.

## 2016-03-06 NOTE — Progress Notes (Signed)
BP 110/80   Pulse 60   Temp 98.3 F (36.8 C) (Oral)   Ht _0  (1.778 m)   Wt 247 lb 4 oz (112.2 kg)   BMI 35.48 kg/m    CC: CPE Subjective:    Patient ID: Alejandro Decker, male    DOB: [birth year], 55 y.o.   MRN: [id number]  HPI: Alejandro Decker is a 55 y.o. male presenting on 03/06/2016 for Annual Exam   Recent summer trip to [facility]  Preventative: Colon cancer screening - discussed, pt requests stool kit Prostate cancer screening - discussed, would like screening. No fmhx colon or prostate cancer. Nocturia x1, strong stream. No BM changes, no blood in stool noted.  Flu at work Last tetanus 2010 Seat belt use discussed Sunscreen use discussed. 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 Interim medical history since our last visit reviewed. Allergies and medications reviewed and updated. Current Outpatient Prescriptions on File Prior to Visit  Medication Sig  . fluticasone (FLONASE) 50 MCG/ACT nasal spray Place 2 sprays into both nostrils daily.  . diphenhydrAMINE (BENADRYL) 25 mg capsule Take 50 mg by mouth at bedtime as needed.    No current facility-administered medications on file prior to visit.     Review of Systems  Constitutional: Negative for activity change, appetite change, chills, fatigue, fever and unexpected weight change.  HENT: Negative for hearing loss.   Eyes: Negative for visual disturbance.  Respiratory: Negative for cough, chest tightness, shortness of breath and wheezing.   Cardiovascular: Negative for chest pain, palpitations and leg swelling.  Gastrointestinal: Negative for abdominal distention, abdominal pain, blood in stool, constipation, diarrhea,  nausea and vomiting.  Genitourinary: Negative for difficulty urinating and hematuria.  Musculoskeletal: Negative for arthralgias, myalgias and neck pain.  Skin: Negative for rash.  Neurological: Negative for dizziness, seizures, syncope and headaches.  Hematological: Negative for adenopathy. Does not bruise/bleed easily.  Psychiatric/Behavioral: Negative for dysphoric mood. The patient is not nervous/anxious.    Per HPI unless specifically indicated in ROS section     Objective:    BP 110/80   Pulse 60   Temp 98.3 F (36.8 C) (Oral)   Ht _1  (1.778 m)   Wt 247 lb 4 oz (112.2 kg)   BMI 35.48 kg/m   Wt Readings from Last 3 Encounters:  03/06/16 247 lb 4 oz (112.2 kg)  05/11/15 234 lb 8 oz (106.4 kg)  04/29/15 239 lb (108.4 kg)    Physical Exam  Constitutional: He is oriented to person, place, and time. He appears well-developed and well-nourished. No distress.  HENT:  Head: Normocephalic and atraumatic.  Right Ear: Hearing, tympanic membrane, external ear and ear canal normal.  Left Ear: Hearing, tympanic membrane, external ear and ear canal normal.  Nose: Nose normal.  Mouth/Throat: Uvula is midline, oropharynx is clear and moist and mucous membranes are normal. No oropharyngeal exudate, posterior oropharyngeal edema or posterior oropharyngeal erythema.  Eyes: Conjunctivae and EOM are normal. Pupils are equal, round, and reactive to light. No scleral icterus.  Neck: Normal range of motion. Neck supple. No thyromegaly present.  Cardiovascular: Normal rate, regular rhythm, normal heart sounds and intact distal pulses.   No murmur heard. Pulses:  Radial pulses are 2+ on the right side, and 2+ on the left side.  Pulmonary/Chest: Effort normal and breath sounds normal. No respiratory distress. He has no wheezes. He has no rales.  Abdominal: Soft. Bowel sounds are normal. He exhibits no distension and no mass. There is no tenderness. There is no rebound and no guarding.    Genitourinary: Rectum normal and prostate normal. Rectal exam shows no external hemorrhoid, no internal hemorrhoid, no fissure, no mass, no tenderness and anal tone normal. Prostate is not enlarged (20gm) and not tender.  Musculoskeletal: Normal range of motion. He exhibits no edema.  Lymphadenopathy:    He has no cervical adenopathy.  Neurological: He is alert and oriented to person, place, and time.  CN grossly intact, station and gait intact  Skin: Skin is warm and dry. No rash noted.  Psychiatric: He has a normal mood and affect. His behavior is normal. Judgment and thought content normal.  Nursing note and vitals reviewed.  Results for orders placed or performed in visit on 02/18/16  Testosterone  Result Value Ref Range   Testosterone 386 250 - 827 ng/dL   Lab Results  Component Value Date   CHOL 199 02/18/2016   HDL 64.20 02/18/2016   LDLCALC 118 (H) 02/18/2016   LDLDIRECT 103.8 03/11/2013   TRIG 80.0 02/18/2016   CHOLHDL 3 02/18/2016       Assessment & Plan:   Problem List Items Addressed This Visit    Allergic rhinitis   Dyspnea on exertion    Cardiac eval unrevealing Improved with better allergy control.      Healthcare maintenance    Preventative protocols reviewed and updated unless pt declined. Discussed healthy diet and lifestyle.       Relevant Orders   Fecal occult blood, imunochemical   HLD (hyperlipidemia)    Chronic, mild off meds. 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 Discussed noted weight gain as well as diet changes for weight loss.       Other Visit Diagnoses    Special screening for malignant neoplasms, colon    -  Primary       Follow up plan: Return in about 1 year (around 03/06/2017), or as needed, for annual exam, prior fasting for blood work.  Ria Bush, MD

## 2016-03-06 NOTE — Assessment & Plan Note (Signed)
Preventative protocols reviewed and updated unless pt declined. Discussed healthy diet and lifestyle.  

## 2016-03-06 NOTE — Assessment & Plan Note (Addendum)
Chronic, mild off meds. Per cardiology, consider treatment if LDL >130 ASCVD 10 yr risk = 2.8%

## 2016-03-06 NOTE — Progress Notes (Signed)
Pre visit review using our clinic review tool, if applicable. No additional management support is needed unless otherwise documented below in the visit note. 

## 2016-03-06 NOTE — Assessment & Plan Note (Addendum)
Cardiac eval unrevealing Improved with better allergy control.

## 2016-03-06 NOTE — Patient Instructions (Addendum)
Pass by lab to pick up stool kit. You are doing well today  Labs looking ok. Return as needed or in 1 year for next physical.  Health Maintenance, Male A healthy lifestyle and preventative care can promote health and wellness.  Maintain regular health, dental, and eye exams.  Eat a healthy diet. Foods like vegetables, fruits, whole grains, low-fat dairy products, and lean protein foods contain the nutrients you need and are low in calories. Decrease your intake of foods high in solid fats, added sugars, and salt. Get information about a proper diet from your health care provider, if necessary.  Regular physical exercise is one of the most important things you can do for your health. Most adults should get at least 150 minutes of moderate-intensity exercise (any activity that increases your heart rate and causes you to sweat) each week. In addition, most adults need muscle-strengthening exercises on 2 or more days a week.   Maintain a healthy weight. The body mass index (BMI) is a screening tool to identify possible weight problems. It provides an estimate of body fat based on height and weight. Your health care provider can find your BMI and can help you achieve or maintain a healthy weight. For males 20 years and older:  A BMI below 18.5 is considered underweight.  A BMI of 18.5 to 24.9 is normal.  A BMI of 25 to 29.9 is considered overweight.  A BMI of 30 and above is considered obese.  Maintain normal blood lipids and cholesterol by exercising and minimizing your intake of saturated fat. Eat a balanced diet with plenty of fruits and vegetables. Blood tests for lipids and cholesterol should begin at age 52 and be repeated every 5 years. If your lipid or cholesterol levels are high, you are over age 84, or you are at high risk for heart disease, you may need your cholesterol levels checked more frequently.Ongoing high lipid and cholesterol levels should be treated with medicines if diet  and exercise are not working.  If you smoke, find out from your health care provider how to quit. If you do not use tobacco, do not start.  Lung cancer screening is recommended for adults aged 76-80 years who are at high risk for developing lung cancer because of a history of smoking. A yearly low-dose CT scan of the lungs is recommended for people who have at least a 30-pack-year history of smoking and are current smokers or have quit within the past 15 years. A pack year of smoking is smoking an average of 1 pack of cigarettes a day for 1 year (for example, a 30-pack-year history of smoking could mean smoking 1 pack a day for 30 years or 2 packs a day for 15 years). Yearly screening should continue until the smoker has stopped smoking for at least 15 years. Yearly screening should be stopped for people who develop a health problem that would prevent them from having lung cancer treatment.  If you choose to drink alcohol, do not have more than 2 drinks per day. One drink is considered to be 12 oz (360 mL) of beer, 5 oz (150 mL) of wine, or 1.5 oz (45 mL) of liquor.  Avoid the use of street drugs. Do not share needles with anyone. Ask for help if you need support or instructions about stopping the use of drugs.  High blood pressure causes heart disease and increases the risk of stroke. High blood pressure is more likely to develop in:  People  who have blood pressure in the end of the normal range (100-139/85-89 mm Hg).  People who are overweight or obese.  People who are African American.  If you are 23-27 years of age, have your blood pressure checked every 3-5 years. If you are 82 years of age or older, have your blood pressure checked every year. You should have your blood pressure measured twice--once when you are at a hospital or clinic, and once when you are not at a hospital or clinic. Record the average of the two measurements. To check your blood pressure when you are not at a hospital or  clinic, you can use:  An automated blood pressure machine at a pharmacy.  A home blood pressure monitor.  If you are 70-45 years old, ask your health care provider if you should take aspirin to prevent heart disease.  Diabetes screening involves taking a blood sample to check your fasting blood sugar level. This should be done once every 3 years after age 84 if you are at a normal weight and without risk factors for diabetes. Testing should be considered at a younger age or be carried out more frequently if you are overweight and have at least 1 risk factor for diabetes.  Colorectal cancer can be detected and often prevented. Most routine colorectal cancer screening begins at the age of 71 and continues through age 11. However, your health care provider may recommend screening at an earlier age if you have risk factors for colon cancer. On a yearly basis, your health care provider may provide home test kits to check for hidden blood in the stool. A small camera at the end of a tube may be used to directly examine the colon (sigmoidoscopy or colonoscopy) to detect the earliest forms of colorectal cancer. Talk to your health care provider about this at age 24 when routine screening begins. A direct exam of the colon should be repeated every 5-10 years through age 56, unless early forms of precancerous polyps or small growths are found.  People who are at an increased risk for hepatitis B should be screened for this virus. You are considered at high risk for hepatitis B if:  You were born in a country where hepatitis B occurs often. Talk with your health care provider about which countries are considered high risk.  Your parents were born in a high-risk country and you have not received a shot to protect against hepatitis B (hepatitis B vaccine).  You have HIV or AIDS.  You use needles to inject street drugs.  You live with, or have sex with, someone who has hepatitis B.  You are a man who has  sex with other men (MSM).  You get hemodialysis treatment.  You take certain medicines for conditions like cancer, organ transplantation, and autoimmune conditions.  Hepatitis C blood testing is recommended for all people born from [birth year] through 1964 and any individual with known risk factors for hepatitis C.  Healthy men should no longer receive prostate-specific antigen (PSA) blood tests as part of routine cancer screening. Talk to your health care provider about prostate cancer screening.  Testicular cancer screening is not recommended for adolescents or adult males who have no symptoms. Screening includes self-exam, a health care provider exam, and other screening tests. Consult with your health care provider about any symptoms you have or any concerns you have about testicular cancer.  Practice safe sex. Use condoms and avoid high-risk sexual practices to reduce the spread of sexually  transmitted infections (STIs).  You should be screened for STIs, including gonorrhea and chlamydia if:  You are sexually active and are younger than 24 years.  You are older than 24 years, and your health care provider tells you that you are at risk for this type of infection.  Your sexual activity has changed since you were last screened, and you are at an increased risk for chlamydia or gonorrhea. Ask your health care provider if you are at risk.  If you are at risk of being infected with HIV, it is recommended that you take a prescription medicine daily to prevent HIV infection. This is called pre-exposure prophylaxis (PrEP). You are considered at risk if:  You are a man who has sex with other men (MSM).  You are a heterosexual man who is sexually active with multiple partners.  You take drugs by injection.  You are sexually active with a partner who has HIV.  Talk with your health care provider about whether you are at high risk of being infected with HIV. If you choose to begin PrEP, you should  first be tested for HIV. You should then be tested every 3 months for as long as you are taking PrEP.  Use sunscreen. Apply sunscreen liberally and repeatedly throughout the day. You should seek shade when your shadow is shorter than you. Protect yourself by wearing long sleeves, pants, a wide-brimmed hat, and sunglasses year round whenever you are outdoors.  Tell your health care provider of new moles or changes in moles, especially if there is a change in shape or color. Also, tell your health care provider if a mole is larger than the size of a pencil eraser.  A one-time screening for abdominal aortic aneurysm (AAA) and surgical repair of large AAAs by ultrasound is recommended for men aged 92-75 years who are current or former smokers.  Stay current with your vaccines (immunizations).   This information is not intended to replace advice given to you by your health care provider. Make sure you discuss any questions you have with your health care provider.   Document Released: 01/20/2008 Document Revised: 08/14/2014 Document Reviewed: 12/19/2010 Elsevier Interactive Patient Education [employer].

## 2017-02-12 DIAGNOSIS — H11422 Conjunctival edema, left eye: Secondary | ICD-10-CM | POA: Diagnosis not present

## 2017-04-22 ENCOUNTER — Other Ambulatory Visit: Payer: Self-pay | Admitting: Family Medicine

## 2017-04-22 DIAGNOSIS — E785 Hyperlipidemia, unspecified: Secondary | ICD-10-CM

## 2017-04-22 DIAGNOSIS — Z125 Encounter for screening for malignant neoplasm of prostate: Secondary | ICD-10-CM

## 2017-04-27 ENCOUNTER — Other Ambulatory Visit (INDEPENDENT_AMBULATORY_CARE_PROVIDER_SITE_OTHER): Payer: 59

## 2017-04-27 DIAGNOSIS — E785 Hyperlipidemia, unspecified: Secondary | ICD-10-CM | POA: Diagnosis not present

## 2017-04-27 DIAGNOSIS — Z125 Encounter for screening for malignant neoplasm of prostate: Secondary | ICD-10-CM

## 2017-04-27 LAB — LIPID PANEL
CHOLESTEROL: 204 mg/dL — AB (ref 0–200)
HDL: 68 mg/dL (ref >39.00)
LDL Cholesterol: 124 mg/dL — ABNORMAL HIGH (ref 0–99)
NonHDL: 136.37
Total CHOL/HDL Ratio: 3
Triglycerides: 63 mg/dL (ref 0.0–149.0)
VLDL: 12.6 mg/dL (ref 0.0–40.0)

## 2017-04-27 LAB — BASIC METABOLIC PANEL
BUN: 29 mg/dL — ABNORMAL HIGH (ref 6–23)
CALCIUM: 9.2 mg/dL (ref 8.4–10.5)
CO2: 26 meq/L (ref 19–32)
Chloride: 108 mEq/L (ref 96–112)
Creatinine, Ser: 1.35 mg/dL (ref 0.40–1.50)
GFR: 58.3 mL/min — ABNORMAL LOW (ref >60.00)
Glucose, Bld: 103 mg/dL — ABNORMAL HIGH (ref 70–99)
POTASSIUM: 4.8 meq/L (ref 3.5–5.1)
Sodium: 142 mEq/L (ref 135–145)

## 2017-04-27 LAB — PSA: PSA: 0.57 ng/mL (ref 0.10–4.00)

## 2017-05-04 ENCOUNTER — Encounter: Payer: Self-pay | Admitting: Family Medicine

## 2017-05-04 ENCOUNTER — Encounter (INDEPENDENT_AMBULATORY_CARE_PROVIDER_SITE_OTHER): Payer: Self-pay

## 2017-05-04 ENCOUNTER — Ambulatory Visit (INDEPENDENT_AMBULATORY_CARE_PROVIDER_SITE_OTHER): Payer: 59 | Admitting: Family Medicine

## 2017-05-04 VITALS — BP 122/76 | HR 68 | Temp 98.3°F | Wt 242.2 lb

## 2017-05-04 DIAGNOSIS — E669 Obesity, unspecified: Secondary | ICD-10-CM

## 2017-05-04 DIAGNOSIS — E785 Hyperlipidemia, unspecified: Secondary | ICD-10-CM

## 2017-05-04 DIAGNOSIS — Z0000 Encounter for general adult medical examination without abnormal findings: Secondary | ICD-10-CM

## 2017-05-04 DIAGNOSIS — Z1211 Encounter for screening for malignant neoplasm of colon: Secondary | ICD-10-CM

## 2017-05-04 NOTE — Progress Notes (Signed)
BP 122/76 (BP Location: Left Arm, Patient Position: Sitting, Cuff Size: Large)   Pulse 68   Temp 98.3 F (36.8 C) (Oral)   Wt 242 lb 4 oz (109.9 kg)   SpO2 95%   BMI 34.76 kg/m    CC: CPE Subjective:    Patient ID: Alejandro Decker, male    DOB: 30-Nov-1960, 56 y.o.   MRN: [id number]  HPI: Alejandro Decker is a 56 y.o. male presenting on 05/04/2017 for Annual Exam   Preventative: Colon cancer screening - discussed, pt requests stool kit (never returned last year) Prostate cancer screening - discussed, would like screening. No fmhx colon or prostate cancer. Nocturia x1, strong stream. No BM changes, no blood in stool noted  Flu at work  Last tetanus 2010  Seat belt use discussed Sunscreen use discussed. 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 Interim medical history since our last visit reviewed. Allergies and medications reviewed and updated. Outpatient Medications Prior to Visit  Medication Sig Dispense Refill  . diphenhydrAMINE (BENADRYL) 25 mg capsule Take 50 mg by mouth at bedtime as needed.     . fexofenadine (ALLEGRA) 180 MG tablet Take 180 mg by mouth daily.    . fluticasone (FLONASE) 50 MCG/ACT nasal spray Place 2 sprays into both nostrils daily.     No facility-administered medications prior to visit.      Per HPI unless specifically indicated in ROS section below Review of Systems  Constitutional: Negative for activity change, appetite change, chills, fatigue, fever and unexpected weight change.  HENT: Negative for hearing loss.   Eyes: Negative for visual disturbance.  Respiratory: Negative for cough, chest tightness, shortness of breath and  wheezing.   Cardiovascular: Negative for chest pain, palpitations and leg swelling.  Gastrointestinal: Negative for abdominal distention, abdominal pain, blood in stool, constipation, diarrhea, nausea and vomiting.  Genitourinary: Negative for difficulty urinating and hematuria.  Musculoskeletal: Negative for arthralgias, myalgias and neck pain.  Skin: Negative for rash.  Neurological: Negative for dizziness, seizures, syncope and headaches.  Hematological: Negative for adenopathy. Does not bruise/bleed easily.  Psychiatric/Behavioral: Negative for dysphoric mood. The patient is not nervous/anxious.        Objective:    BP 122/76 (BP Location: Left Arm, Patient Position: Sitting, Cuff Size: Large)   Pulse 68   Temp 98.3 F (36.8 C) (Oral)   Wt 242 lb 4 oz (109.9 kg)   SpO2 95%   BMI 34.76 kg/m   Wt Readings from Last 3 Encounters:  05/04/17 242 lb 4 oz (109.9 kg)  03/06/16 247 lb 4 oz (112.2 kg)  05/11/15 234 lb 8 oz (106.4 kg)    Physical Exam  Constitutional: He is oriented to person, place, and time. He appears well-developed and well-nourished. No distress.  HENT:  Head: Normocephalic and atraumatic.  Right Ear: Hearing, tympanic membrane, external ear and ear canal normal.  Left Ear: Hearing, tympanic membrane, external ear and ear canal normal.  Nose: Nose normal.  Mouth/Throat: Uvula is midline, oropharynx is clear and moist and mucous membranes are normal. No oropharyngeal exudate, posterior oropharyngeal edema or posterior oropharyngeal erythema.  Eyes: Pupils are equal, round, and reactive to light. Conjunctivae and EOM  are normal. No scleral icterus.  Neck: Normal range of motion. Neck supple. No thyromegaly present.  Cardiovascular: Normal rate, regular rhythm, normal heart sounds and intact distal pulses.   No murmur heard. Pulses:      Radial pulses are 2+ on the right side, and 2+ on the left side.  Pulmonary/Chest: Effort normal and breath sounds normal. No  respiratory distress. He has no wheezes. He has no rales.  Abdominal: Soft. Bowel sounds are normal. He exhibits no distension and no mass. There is no tenderness. There is no rebound and no guarding.  Musculoskeletal: Normal range of motion. He exhibits no edema.  Lymphadenopathy:    He has no cervical adenopathy.  Neurological: He is alert and oriented to person, place, and time.  CN grossly intact, station and gait intact  Skin: Skin is warm and dry. No rash noted.  Psychiatric: He has a normal mood and affect. His behavior is normal. Judgment and thought content normal.  Nursing note and vitals reviewed.  Results for orders placed or performed in visit on 04/27/17  Lipid panel  Result Value Ref Range   Cholesterol 204 (H) 0 - 200 mg/dL   Triglycerides 63.0 0.0 - 149.0 mg/dL   HDL 68.00 >39.00 mg/dL   VLDL 12.6 0.0 - 40.0 mg/dL   LDL Cholesterol 124 (H) 0 - 99 mg/dL   Total CHOL/HDL Ratio 3    NonHDL 338.32   Basic metabolic panel  Result Value Ref Range   Sodium 142 135 - 145 mEq/L   Potassium 4.8 3.5 - 5.1 mEq/L   Chloride 108 96 - 112 mEq/L   CO2 26 19 - 32 mEq/L   Glucose, Bld 103 (H) 70 - 99 mg/dL   BUN 29 (H) 6 - 23 mg/dL   Creatinine, Ser 1.35 0.40 - 1.50 mg/dL   Calcium 9.2 8.4 - 10.5 mg/dL   GFR 58.30 (L) >60.00 mL/min  PSA  Result Value Ref Range   PSA 0.57 0.10 - 4.00 ng/mL      Assessment & Plan:   Problem List Items Addressed This Visit    Healthcare maintenance - Primary    Preventative protocols reviewed and updated unless pt declined. Discussed healthy diet and lifestyle.       HLD (hyperlipidemia)    Chronic, stable off meds. Reviewed healthy diet choices to improve LDL. The 10-year ASCVD risk score Mikey Bussing DC Brooke Bonito., et al., 2013) is: 4%   Values used to calculate the score:     Age: 70 years     Sex: Male     Is Non-Hispanic African American: No     Diabetic: No     Tobacco smoker: No     Systolic Blood Pressure: 919 mmHg     Is BP treated: No      HDL Cholesterol: 68 mg/dL     Total Cholesterol: 204 mg/dL       Obesity, Class I, BMI 30-34.9    Reviewed healthy diet and lifestyle changes for sustainable weight loss.        Other Visit Diagnoses    Special screening for malignant neoplasms, colon       Relevant Orders   Fecal occult blood, imunochemical       Follow up plan: Return in about 1 year (around 05/04/2018) for annual exam, prior fasting for blood work.  Ria Bush, MD

## 2017-05-04 NOTE — Assessment & Plan Note (Signed)
Reviewed healthy diet and lifestyle changes for sustainable weight loss.

## 2017-05-04 NOTE — Patient Instructions (Addendum)
Pass by lab to pick up stool kit.  Flu shot at work.  Increase water intake.  Return as needed or in 1 year for next physical.  Health Maintenance, Male A healthy lifestyle and preventive care is important for your health and wellness. Ask your health care provider about what schedule of regular examinations is right for you. What should I know about weight and diet? Eat a Healthy Diet  Eat plenty of vegetables, fruits, whole grains, low-fat dairy products, and lean protein.  Do not eat a lot of foods high in solid fats, added sugars, or salt.  Maintain a Healthy Weight Regular exercise can help you achieve or maintain a healthy weight. You should:  Do at least 150 minutes of exercise each week. The exercise should increase your heart rate and make you sweat (moderate-intensity exercise).  Do strength-training exercises at least twice a week.  Watch Your Levels of Cholesterol and Blood Lipids  Have your blood tested for lipids and cholesterol every 5 years starting at 55 years of age. If you are at high risk for heart disease, you should start having your blood tested when you are 55 years old. You may need to have your cholesterol levels checked more often if: ? Your lipid or cholesterol levels are high. ? You are older than 55 years of age. ? You are at high risk for heart disease.  What should I know about cancer screening? Many types of cancers can be detected early and may often be prevented. Lung Cancer  You should be screened every year for lung cancer if: ? You are a current smoker who has smoked for at least 30 years. ? You are a former smoker who has quit within the past 15 years.  Talk to your health care provider about your screening options, when you should start screening, and how often you should be screened.  Colorectal Cancer  Routine colorectal cancer screening usually begins at 55 years of age and should be repeated every 5-10 years until you are 55 years  old. You may need to be screened more often if early forms of precancerous polyps or small growths are found. Your health care provider may recommend screening at an earlier age if you have risk factors for colon cancer.  Your health care provider may recommend using home test kits to check for hidden blood in the stool.  A small camera at the end of a tube can be used to examine your colon (sigmoidoscopy or colonoscopy). This checks for the earliest forms of colorectal cancer.  Prostate and Testicular Cancer  Depending on your age and overall health, your health care provider may do certain tests to screen for prostate and testicular cancer.  Talk to your health care provider about any symptoms or concerns you have about testicular or prostate cancer.  Skin Cancer  Check your skin from head to toe regularly.  Tell your health care provider about any new moles or changes in moles, especially if: ? There is a change in a mole's size, shape, or color. ? You have a mole that is larger than a pencil eraser.  Always use sunscreen. Apply sunscreen liberally and repeat throughout the day.  Protect yourself by wearing long sleeves, pants, a wide-brimmed hat, and sunglasses when outside.  What should I know about heart disease, diabetes, and high blood pressure?  If you are 5-74 years of age, have your blood pressure checked every 3-5 years. If you are 40 years of  age or older, have your blood pressure checked every year. You should have your blood pressure measured twice-once when you are at a hospital or clinic, and once when you are not at a hospital or clinic. Record the average of the two measurements. To check your blood pressure when you are not at a hospital or clinic, you can use: ? An automated blood pressure machine at a pharmacy. ? A home blood pressure monitor.  Talk to your health care provider about your target blood pressure.  If you are between 27-78 years old, ask your  health care provider if you should take aspirin to prevent heart disease.  Have regular diabetes screenings by checking your fasting blood sugar level. ? If you are at a normal weight and have a low risk for diabetes, have this test once every three years after the age of 19. ? If you are overweight and have a high risk for diabetes, consider being tested at a younger age or more often.  A one-time screening for abdominal aortic aneurysm (AAA) by ultrasound is recommended for men aged 8-75 years who are current or former smokers. What should I know about preventing infection? Hepatitis B If you have a higher risk for hepatitis B, you should be screened for this virus. Talk with your health care provider to find out if you are at risk for hepatitis B infection. Hepatitis C Blood testing is recommended for:  Everyone born from [birth year] through 1964.  Anyone with known risk factors for hepatitis C.  Sexually Transmitted Diseases (STDs)  You should be screened each year for STDs including gonorrhea and chlamydia if: ? You are sexually active and are younger than 55 years of age. ? You are older than 55 years of age and your health care provider tells you that you are at risk for this type of infection. ? Your sexual activity has changed since you were last screened and you are at an increased risk for chlamydia or gonorrhea. Ask your health care provider if you are at risk.  Talk with your health care provider about whether you are at high risk of being infected with HIV. Your health care provider may recommend a prescription medicine to help prevent HIV infection.  What else can I do?  Schedule regular health, dental, and eye exams.  Stay current with your vaccines (immunizations).  Do not use any tobacco products, such as cigarettes, chewing tobacco, and e-cigarettes. If you need help quitting, ask your health care provider.  Limit alcohol intake to no more than 2 drinks per day. One  drink equals 12 ounces of beer, 5 ounces of wine, or 1 ounces of hard liquor.  Do not use street drugs.  Do not share needles.  Ask your health care provider for help if you need support or information about quitting drugs.  Tell your health care provider if you often feel depressed.  Tell your health care provider if you have ever been abused or do not feel safe at home. This information is not intended to replace advice given to you by your health care provider. Make sure you discuss any questions you have with your health care provider. Document Released: 01/20/2008 Document Revised: 03/22/2016 Document Reviewed: 04/27/2015 Elsevier Interactive Patient Education  [employer].

## 2017-05-04 NOTE — Assessment & Plan Note (Signed)
Chronic, stable off meds. Reviewed healthy diet choices to improve LDL. The 10-year ASCVD risk score Mikey Bussing DC Brooke Bonito., et al., 2013) is: 4%   Values used to calculate the score:     Age: 56 years     Sex: Male     Is Non-Hispanic African American: No     Diabetic: No     Tobacco smoker: No     Systolic Blood Pressure: 355 mmHg     Is BP treated: No     HDL Cholesterol: 68 mg/dL     Total Cholesterol: 204 mg/dL

## 2017-05-04 NOTE — Assessment & Plan Note (Signed)
Preventative protocols reviewed and updated unless pt declined. Discussed healthy diet and lifestyle.  

## 2017-05-16 ENCOUNTER — Encounter: Payer: Self-pay | Admitting: Family Medicine

## 2017-05-16 ENCOUNTER — Other Ambulatory Visit: Payer: 59

## 2017-05-16 DIAGNOSIS — Z1211 Encounter for screening for malignant neoplasm of colon: Secondary | ICD-10-CM

## 2017-05-16 LAB — FECAL OCCULT BLOOD, IMMUNOCHEMICAL: FECAL OCCULT BLD: NEGATIVE

## 2017-05-16 LAB — FECAL OCCULT BLOOD, GUAIAC: Fecal Occult Blood: NEGATIVE

## 2017-09-25 ENCOUNTER — Encounter: Payer: Self-pay | Admitting: Internal Medicine

## 2017-09-25 ENCOUNTER — Ambulatory Visit: Payer: 59 | Admitting: Internal Medicine

## 2017-09-25 VITALS — BP 126/82 | HR 88 | Temp 99.9°F | Wt 242.0 lb

## 2017-09-25 DIAGNOSIS — J101 Influenza due to other identified influenza virus with other respiratory manifestations: Secondary | ICD-10-CM

## 2017-09-25 DIAGNOSIS — J029 Acute pharyngitis, unspecified: Secondary | ICD-10-CM

## 2017-09-25 DIAGNOSIS — R509 Fever, unspecified: Secondary | ICD-10-CM | POA: Diagnosis not present

## 2017-09-25 DIAGNOSIS — R52 Pain, unspecified: Secondary | ICD-10-CM

## 2017-09-25 MED ORDER — OSELTAMIVIR PHOSPHATE 75 MG PO CAPS: 75.0000 mg | ORAL_CAPSULE | Freq: Two times a day (BID) | ORAL | 0 refills | Status: DC

## 2017-09-25 NOTE — Patient Instructions (Addendum)

## 2017-09-25 NOTE — Progress Notes (Signed)
Subjective:    Patient ID: Alejandro Decker, male    DOB: 03-08-1961, 57 y.o.   MRN: [id number]  HPI  Pt presents to the clinic today with c/o ear fullness and sore throat. This started 3 days ago. He denies ear pain or decreased hearing. He is having some difficulty swallowing. He denies runny nose, cough or chest congestion. He has had fever up to 100.0, chills and body aches. He has tried Nyquil and Ibuprofen with minimal relief. He has had sick contacts diagnosed with strep.  Review of Systems      Past Medical History:  Diagnosis Date  . GERD (gastroesophageal reflux disease)   . Hyperlipemia    statin caused bump in LFTs, saw cards, rec treatment if LDL >130  . 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 diphenhydrAMINE (BENADRYL) 25 mg capsule Take 50 mg by mouth at bedtime as needed.     . fexofenadine (ALLEGRA) 180 MG tablet Take 180 mg by mouth daily.    . fluticasone (FLONASE) 50 MCG/ACT nasal spray Place 2 sprays into both nostrils daily.     No current facility-administered medications for this visit.     No Known Allergies  Family History  Problem Relation Age of Onset  . Benign prostatic hyperplasia Father   . Diabetes Maternal Grandfather   . Parkinsonism Paternal Grandmother   . Heart failure Maternal Grandfather   . Stroke Paternal Grandmother   . Coronary artery disease Neg Hx   . Cancer Neg Hx     Social History   Socioeconomic History  . Marital status: Married    Spouse name: Not on file  . Number of children: 2  . Years of education: Not on file  . Highest education level: Not on file  Social Needs  . Financial resource strain: Not on file  . Food insecurity - worry: Not on file  . Food insecurity - inability: Not on file  . Transportation needs - medical: Not on file  . Transportation needs - non-medical: Not on file  Occupational History  . Occupation: Chief Financial Officer, [employer]   Employer: [employer]    Comment: [employer]  Tobacco Use  . Smoking status: Never Smoker  . Smokeless tobacco: Never Used  Substance and Sexual Activity  . Alcohol use: Yes    Alcohol/week: 3.0 oz    Types: 6 drink(s) per week    Comment: weekly  . Drug use: No  . Sexual activity: Yes  Other Topics Concern  . 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 Denies malaise, fatigue, headache or abrupt weight changes.  HEENT: Pt reports ear fullness and sore throat. Denies eye pain, eye redness, ear pain, ringing in the ears, wax buildup, runny nose, nasal congestion, bloody nose. Respiratory: Denies difficulty breathing, shortness of breath, cough or sputum production.    No other specific complaints in a complete review of systems (except as listed in HPI above).  Objective:   Physical Exam    BP 126/82   Pulse 88   Temp 99.9 F (37.7 C) (Oral)   Wt 242 lb (109.8 kg)   SpO2 97%  BMI 34.72 kg/m  Wt Readings from Last 3 Encounters:  09/25/17 242 lb (109.8 kg)  05/04/17 242 lb 4 oz (109.9 kg)  03/06/16 247 lb 4 oz (112.2 kg)    General: Appears his stated age, in NAD. HEENT: Head: normal shape and size, no sinus tenderness noted; Ears: Tm's gray and intact, normal light reflex; Throat/Mouth: Teeth present, mucosa erythematous and moist, no exudate, lesions or ulcerations noted.  Neck:  No adenopathy noted. Pulmonary/Chest: Normal effort and positive vesicular breath sounds. No respiratory distress. No wheezes, rales or ronchi noted.   BMET    Component Value Date/Time   NA 142 04/27/2017 0820   K 4.8 04/27/2017 0820   CL 108 04/27/2017 0820   CO2 26 04/27/2017 0820   GLUCOSE 103 (H) 04/27/2017 0820   BUN  29 (H) 04/27/2017 0820   CREATININE 1.35 04/27/2017 0820   CALCIUM 9.2 04/27/2017 0820   GFRNONAA 73.68 01/11/2010 0905   GFRAA 70 12/12/2007 0921    Lipid Panel     Component Value Date/Time   CHOL 204 (H) 04/27/2017 0820   TRIG 63.0 04/27/2017 0820   HDL 68.00 04/27/2017 0820   CHOLHDL 3 04/27/2017 0820   VLDL 12.6 04/27/2017 0820   LDLCALC 124 (H) 04/27/2017 0820    CBC    Component Value Date/Time   WBC 6.6 10/16/2013 1328   RBC 4.71 10/16/2013 1328   HGB 14.9 10/16/2013 1328   HCT 43.5 10/16/2013 1328   PLT 266.0 10/16/2013 1328   MCV 92.4 10/16/2013 1328   MCHC 34.3 10/16/2013 1328   RDW 12.6 10/16/2013 1328   LYMPHSABS 2.0 10/16/2013 1328   MONOABS 0.4 10/16/2013 1328   EOSABS 0.4 10/16/2013 1328   BASOSABS 0.0 10/16/2013 1328    Hgb A1C No results found for: HGBA1C        Assessment & Plan:   Sore Throat, Fever, Chills or Body Aches due to Influenza:  Rapid Strep: negative Rapid Flu: positive Salt water gargles for sore throat Continue Ibuprofen for fever and body aches eRx for Tamiflu 75 mg BID x 5 days  Return precautions discussed Webb Silversmith, NP

## 2017-09-26 LAB — POC INFLUENZA A&B (BINAX/QUICKVUE)
INFLUENZA A, POC: POSITIVE — AB
INFLUENZA B, POC: NEGATIVE

## 2017-09-26 LAB — POCT RAPID STREP A (OFFICE): Rapid Strep A Screen: NEGATIVE

## 2017-09-26 NOTE — Addendum Note (Signed)
Addended by: Lurlean Nanny on: 09/26/2017 04:44 PM   Modules accepted: Orders

## 2018-04-24 DIAGNOSIS — L918 Other hypertrophic disorders of the skin: Secondary | ICD-10-CM | POA: Diagnosis not present

## 2018-04-24 DIAGNOSIS — B078 Other viral warts: Secondary | ICD-10-CM | POA: Diagnosis not present

## 2018-04-24 DIAGNOSIS — L821 Other seborrheic keratosis: Secondary | ICD-10-CM | POA: Diagnosis not present

## 2018-05-27 DIAGNOSIS — B078 Other viral warts: Secondary | ICD-10-CM | POA: Diagnosis not present

## 2018-05-27 DIAGNOSIS — L565 Disseminated superficial actinic porokeratosis (DSAP): Secondary | ICD-10-CM | POA: Diagnosis not present

## 2018-06-24 DIAGNOSIS — L565 Disseminated superficial actinic porokeratosis (DSAP): Secondary | ICD-10-CM | POA: Diagnosis not present

## 2018-10-08 ENCOUNTER — Other Ambulatory Visit: Payer: 59

## 2018-10-09 ENCOUNTER — Telehealth: Payer: Self-pay | Admitting: Family Medicine

## 2018-10-09 ENCOUNTER — Other Ambulatory Visit: Payer: Self-pay | Admitting: Family Medicine

## 2018-10-09 ENCOUNTER — Other Ambulatory Visit (INDEPENDENT_AMBULATORY_CARE_PROVIDER_SITE_OTHER): Payer: Managed Care, Other (non HMO)

## 2018-10-09 DIAGNOSIS — E785 Hyperlipidemia, unspecified: Secondary | ICD-10-CM

## 2018-10-09 DIAGNOSIS — Z125 Encounter for screening for malignant neoplasm of prostate: Secondary | ICD-10-CM

## 2018-10-09 DIAGNOSIS — E669 Obesity, unspecified: Secondary | ICD-10-CM

## 2018-10-09 LAB — COMPREHENSIVE METABOLIC PANEL
ALBUMIN: 4.3 g/dL (ref 3.5–5.2)
ALK PHOS: 75 U/L (ref 39–117)
ALT: 28 U/L (ref 0–53)
AST: 26 U/L (ref 0–37)
BILIRUBIN TOTAL: 0.9 mg/dL (ref 0.2–1.2)
BUN: 28 mg/dL — AB (ref 6–23)
CO2: 28 mEq/L (ref 19–32)
Calcium: 9.3 mg/dL (ref 8.4–10.5)
Chloride: 103 mEq/L (ref 96–112)
Creatinine, Ser: 1.43 mg/dL (ref 0.40–1.50)
GFR: 51.06 mL/min — AB (ref >60.00)
GLUCOSE: 87 mg/dL (ref 70–99)
POTASSIUM: 4.5 meq/L (ref 3.5–5.1)
SODIUM: 140 meq/L (ref 135–145)
TOTAL PROTEIN: 6.4 g/dL (ref 6.0–8.3)

## 2018-10-09 LAB — LIPID PANEL
CHOLESTEROL: 202 mg/dL — AB (ref 0–200)
HDL: 80.8 mg/dL (ref >39.00)
LDL Cholesterol: 105 mg/dL — ABNORMAL HIGH (ref 0–99)
NONHDL: 120.82
Total CHOL/HDL Ratio: 2
Triglycerides: 80 mg/dL (ref 0.0–149.0)
VLDL: 16 mg/dL (ref 0.0–40.0)

## 2018-10-09 LAB — PSA: PSA: 0.72 ng/mL (ref 0.10–4.00)

## 2018-10-09 NOTE — Telephone Encounter (Signed)
error 

## 2018-10-11 ENCOUNTER — Encounter: Payer: Self-pay | Admitting: Family Medicine

## 2018-10-11 ENCOUNTER — Ambulatory Visit (INDEPENDENT_AMBULATORY_CARE_PROVIDER_SITE_OTHER): Payer: Managed Care, Other (non HMO) | Admitting: Family Medicine

## 2018-10-11 VITALS — BP 132/70 | HR 61 | Temp 97.6°F | Ht 69.5 in | Wt 242.3 lb

## 2018-10-11 DIAGNOSIS — Z0000 Encounter for general adult medical examination without abnormal findings: Secondary | ICD-10-CM | POA: Diagnosis not present

## 2018-10-11 DIAGNOSIS — Z1211 Encounter for screening for malignant neoplasm of colon: Secondary | ICD-10-CM

## 2018-10-11 DIAGNOSIS — E785 Hyperlipidemia, unspecified: Secondary | ICD-10-CM

## 2018-10-11 DIAGNOSIS — R7989 Other specified abnormal findings of blood chemistry: Secondary | ICD-10-CM | POA: Diagnosis not present

## 2018-10-11 DIAGNOSIS — Q828 Other specified congenital malformations of skin: Secondary | ICD-10-CM | POA: Insufficient documentation

## 2018-10-11 NOTE — Assessment & Plan Note (Addendum)
Chronic, mild, stable off meds  The 10-year ASCVD risk score Mikey Bussing DC Jr., et al., 2013) is: 4.3%   Values used to calculate the score:     Age: 58 years     Sex: Male     Is Non-Hispanic African American: No     Diabetic: No     Tobacco smoker: No     Systolic Blood Pressure: 951 mmHg     Is BP treated: No     HDL Cholesterol: 80.8 mg/dL     Total Cholesterol: 202 mg/dL

## 2018-10-11 NOTE — Assessment & Plan Note (Signed)
Saw derm.

## 2018-10-11 NOTE — Progress Notes (Signed)
BP 132/70 (BP Location: Left Arm, Patient Position: Sitting, Cuff Size: Large)   Pulse 61   Temp 97.6 F (36.4 C) (Oral)   Ht 5' 9.5" (1.765 m)   Wt 242 lb 5 oz (109.9 kg)   SpO2 96%   BMI 35.27 kg/m    CC: CPE Subjective:    Patient ID: Alejandro Decker, male    DOB: May 13, 1961, 58 y.o.   MRN: [id number]  HPI: Alejandro Decker is a 58 y.o. male presenting on 10/11/2018 for Annual Exam   New rash on arms - saw [facility], told porokeratosis.  Preventative: Colon cancer screening - iFOB neg 05/2017  Prostate cancer screening - discussed, would like continued screening. Flu yearly at work  Td 2010  Seat belt use discussed Sunscreen use discussed. No suspicious moles noted. Saw derm dx with porokeratosis 05/2018. 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 Interim medical history since our last visit reviewed. Allergies and medications reviewed and updated. Outpatient Medications Prior to Visit  Medication Sig Dispense Refill  . diphenhydrAMINE (BENADRYL) 25 mg capsule Take 50 mg by mouth at bedtime as needed.     . fexofenadine (ALLEGRA) 180 MG tablet Take 180 mg by mouth daily.    . fluticasone (FLONASE) 50 MCG/ACT nasal spray Place 2 sprays into both nostrils daily. As needed    . oseltamivir (TAMIFLU) 75 MG capsule Take 1 capsule (75 mg total) by mouth 2 (two) times daily. 10 capsule 0   No facility-administered medications prior to visit.      Per HPI unless specifically indicated in ROS section below Review of Systems  Constitutional: Negative for activity change, appetite change, chills, fatigue, fever and  unexpected weight change.  HENT: Negative for hearing loss.   Eyes: Negative for visual disturbance.  Respiratory: Negative for cough, chest tightness, shortness of breath and wheezing.   Cardiovascular: Negative for chest pain, palpitations and leg swelling.  Gastrointestinal: Negative for abdominal distention, abdominal pain, blood in stool, constipation, diarrhea, nausea and vomiting.  Genitourinary: Negative for difficulty urinating and hematuria.  Musculoskeletal: Negative for arthralgias, myalgias and neck pain.  Skin: Positive for rash.  Neurological: Negative for dizziness, seizures, syncope and headaches.  Hematological: Negative for adenopathy. Does not bruise/bleed easily.  Psychiatric/Behavioral: Negative for dysphoric mood. The patient is not nervous/anxious.    Objective:    BP 132/70 (BP Location: Left Arm, Patient Position: Sitting, Cuff Size: Large)   Pulse 61   Temp 97.6 F (36.4 C) (Oral)   Ht 5' 9.5" (1.765 m)   Wt 242 lb 5 oz (109.9 kg)   SpO2 96%   BMI 35.27 kg/m   Wt Readings from Last 3 Encounters:  10/11/18 242 lb 5 oz (109.9 kg)  09/25/17 242 lb (109.8 kg)  05/04/17 242 lb 4 oz (109.9 kg)    Physical Exam Vitals signs and nursing note reviewed.  Constitutional:      General: He is not in acute distress.    Appearance: He is well-developed.  HENT:     Head: Normocephalic and atraumatic.     Right Ear: Hearing, tympanic membrane, ear canal and  external ear normal.     Left Ear: Hearing, tympanic membrane, ear canal and external ear normal.     Nose: Nose normal.     Mouth/Throat:     Pharynx: Uvula midline. No oropharyngeal exudate or posterior oropharyngeal erythema.  Eyes:     General: No scleral icterus.    Conjunctiva/sclera: Conjunctivae normal.     Pupils: Pupils are equal, round, and reactive to light.  Neck:     Musculoskeletal: Normal range of motion and neck supple.  Cardiovascular:     Rate and Rhythm: Normal rate and regular rhythm.       Pulses:          Radial pulses are 2+ on the right side and 2+ on the left side.     Heart sounds: Normal heart sounds. No murmur.  Pulmonary:     Effort: Pulmonary effort is normal. No respiratory distress.     Breath sounds: Normal breath sounds. No wheezing or rales.  Abdominal:     General: Bowel sounds are normal. There is no distension.     Palpations: Abdomen is soft. There is no mass.     Tenderness: There is no abdominal tenderness. There is no guarding or rebound.  Musculoskeletal: Normal range of motion.  Lymphadenopathy:     Cervical: No cervical adenopathy.  Skin:    General: Skin is warm and dry.     Findings: No rash.  Neurological:     Mental Status: He is alert and oriented to person, place, and time.     Comments: CN grossly intact, station and gait intact  Psychiatric:        Behavior: Behavior normal.        Thought Content: Thought content normal.        Judgment: Judgment normal.       Results for orders placed or performed in visit on 10/09/18  PSA  Result Value Ref Range   PSA 0.72 0.10 - 4.00 ng/mL  Comprehensive metabolic panel  Result Value Ref Range   Sodium 140 135 - 145 mEq/L   Potassium 4.5 3.5 - 5.1 mEq/L   Chloride 103 96 - 112 mEq/L   CO2 28 19 - 32 mEq/L   Glucose, Bld 87 70 - 99 mg/dL   BUN 28 (H) 6 - 23 mg/dL   Creatinine, Ser 1.43 0.40 - 1.50 mg/dL   Total Bilirubin 0.9 0.2 - 1.2 mg/dL   Alkaline Phosphatase 75 39 - 117 U/L   AST 26 0 - 37 U/L   ALT 28 0 - 53 U/L   Total Protein 6.4 6.0 - 8.3 g/dL   Albumin 4.3 3.5 - 5.2 g/dL   Calcium 9.3 8.4 - 10.5 mg/dL   GFR 51.06 (L) >60.00 mL/min  Lipid panel  Result Value Ref Range   Cholesterol 202 (H) 0 - 200 mg/dL   Triglycerides 80.0 0.0 - 149.0 mg/dL   HDL 80.80 >39.00 mg/dL   VLDL 16.0 0.0 - 40.0 mg/dL   LDL Cholesterol 105 (H) 0 - 99 mg/dL   Total CHOL/HDL Ratio 2    NonHDL 120.82    Assessment & Plan:   Problem List Items Addressed This Visit    Severe obesity (BMI  35.0-39.9) with comorbidity ([place])    Encouraged continued healthy diet and lifestyle changes to affect sustainable weight control.       Porokeratosis    Saw derm.       HLD (hyperlipidemia)    Chronic, mild,  stable off meds  The 10-year ASCVD risk score Mikey Bussing DC Jr., et al., 2013) is: 4.3%   Values used to calculate the score:     Age: 41 years     Sex: Male     Is Non-Hispanic African American: No     Diabetic: No     Tobacco smoker: No     Systolic Blood Pressure: 156 mmHg     Is BP treated: No     HDL Cholesterol: 80.8 mg/dL     Total Cholesterol: 202 mg/dL       Healthcare maintenance - Primary    Preventative protocols reviewed and updated unless pt declined. Discussed healthy diet and lifestyle.        Other Visit Diagnoses    Special screening for malignant neoplasms, colon       Relevant Orders   Fecal occult blood, imunochemical   Elevated serum creatinine       Relevant Orders   Microalbumin / creatinine urine ratio       No orders of the defined types were placed in this encounter.  Orders Placed This Encounter  Procedures  . Fecal occult blood, imunochemical    Standing Status:   Future    Standing Expiration Date:   10/11/2019  . Microalbumin / creatinine urine ratio    Follow up plan: Return in about 1 year (around 10/11/2019) for annual exam, prior fasting for blood work.  Ria Bush, MD

## 2018-10-11 NOTE — Assessment & Plan Note (Signed)
Encouraged continued healthy diet and lifestyle changes to affect sustainable weight control.

## 2018-10-11 NOTE — Patient Instructions (Addendum)
Pass by lab to pick up stool test.  Urine test today.  You are doing well today.   Health Maintenance, Male A healthy lifestyle and preventive care is important for your health and wellness. Ask your health care provider about what schedule of regular examinations is right for you. What should I know about weight and diet? Eat a Healthy Diet  Eat plenty of vegetables, fruits, whole grains, low-fat dairy products, and lean protein.  Do not eat a lot of foods high in solid fats, added sugars, or salt.  Maintain a Healthy Weight Regular exercise can help you achieve or maintain a healthy weight. You should:  Do at least 150 minutes of exercise each week. The exercise should increase your heart rate and make you sweat (moderate-intensity exercise).  Do strength-training exercises at least twice a week. Watch Your Levels of Cholesterol and Blood Lipids  Have your blood tested for lipids and cholesterol every 5 years starting at 57 years of age. If you are at high risk for heart disease, you should start having your blood tested when you are 57 years old. You may need to have your cholesterol levels checked more often if: ? Your lipid or cholesterol levels are high. ? You are older than 57 years of age. ? You are at high risk for heart disease. What should I know about cancer screening? Many types of cancers can be detected early and may often be prevented. Lung Cancer  You should be screened every year for lung cancer if: ? You are a current smoker who has smoked for at least 30 years. ? You are a former smoker who has quit within the past 15 years.  Talk to your health care provider about your screening options, when you should start screening, and how often you should be screened. Colorectal Cancer  Routine colorectal cancer screening usually begins at 57 years of age and should be repeated every 5-10 years until you are 57 years old. You may need to be screened more often if early  forms of precancerous polyps or small growths are found. Your health care provider may recommend screening at an earlier age if you have risk factors for colon cancer.  Your health care provider may recommend using home test kits to check for hidden blood in the stool.  A small camera at the end of a tube can be used to examine your colon (sigmoidoscopy or colonoscopy). This checks for the earliest forms of colorectal cancer. Prostate and Testicular Cancer  Depending on your age and overall health, your health care provider may do certain tests to screen for prostate and testicular cancer.  Talk to your health care provider about any symptoms or concerns you have about testicular or prostate cancer. Skin Cancer  Check your skin from head to toe regularly.  Tell your health care provider about any new moles or changes in moles, especially if: ? There is a change in a mole's size, shape, or color. ? You have a mole that is larger than a pencil eraser.  Always use sunscreen. Apply sunscreen liberally and repeat throughout the day.  Protect yourself by wearing long sleeves, pants, a wide-brimmed hat, and sunglasses when outside. What should I know about heart disease, diabetes, and high blood pressure?  If you are 54-74 years of age, have your blood pressure checked every 3-5 years. If you are 41 years of age or older, have your blood pressure checked every year. You should have your blood  pressure measured twice-once when you are at a hospital or clinic, and once when you are not at a hospital or clinic. Record the average of the two measurements. To check your blood pressure when you are not at a hospital or clinic, you can use: ? An automated blood pressure machine at a pharmacy. ? A home blood pressure monitor.  Talk to your health care provider about your target blood pressure.  If you are between 27-74 years old, ask your health care provider if you should take aspirin to prevent heart  disease.  Have regular diabetes screenings by checking your fasting blood sugar level. ? If you are at a normal weight and have a low risk for diabetes, have this test once every three years after the age of 57. ? If you are overweight and have a high risk for diabetes, consider being tested at a younger age or more often.  A one-time screening for abdominal aortic aneurysm (AAA) by ultrasound is recommended for men aged 20-75 years who are current or former smokers. What should I know about preventing infection? Hepatitis B If you have a higher risk for hepatitis B, you should be screened for this virus. Talk with your health care provider to find out if you are at risk for hepatitis B infection. Hepatitis C Blood testing is recommended for:  Everyone born from [birth year] through 1965.  Anyone with known risk factors for hepatitis C. Sexually Transmitted Diseases (STDs)  You should be screened each year for STDs including gonorrhea and chlamydia if: ? You are sexually active and are younger than 57 years of age. ? You are older than 57 years of age and your health care provider tells you that you are at risk for this type of infection. ? Your sexual activity has changed since you were last screened and you are at an increased risk for chlamydia or gonorrhea. Ask your health care provider if you are at risk.  Talk with your health care provider about whether you are at high risk of being infected with HIV. Your health care provider may recommend a prescription medicine to help prevent HIV infection. What else can I do?  Schedule regular health, dental, and eye exams.  Stay current with your vaccines (immunizations).  Do not use any tobacco products, such as cigarettes, chewing tobacco, and e-cigarettes. If you need help quitting, ask your health care provider.  Limit alcohol intake to no more than 2 drinks per day. One drink equals 12 ounces of beer, 5 ounces of wine, or 1 ounces of hard  liquor.  Do not use street drugs.  Do not share needles.  Ask your health care provider for help if you need support or information about quitting drugs.  Tell your health care provider if you often feel depressed.  Tell your health care provider if you have ever been abused or do not feel safe at home. This information is not intended to replace advice given to you by your health care provider. Make sure you discuss any questions you have with your health care provider. Document Released: 01/20/2008 Document Revised: 03/22/2016 Document Reviewed: 04/27/2015 Elsevier Interactive Patient Education  2019 [employer].

## 2018-10-11 NOTE — Assessment & Plan Note (Signed)
Preventative protocols reviewed and updated unless pt declined. Discussed healthy diet and lifestyle.  

## 2018-10-12 LAB — MICROALBUMIN / CREATININE URINE RATIO
CREATININE, URINE: 181 mg/dL (ref 20–320)
MICROALB UR: 0.6 mg/dL
MICROALB/CREAT RATIO: 3 ug/mg{creat} (ref <30)

## 2018-10-23 ENCOUNTER — Other Ambulatory Visit (INDEPENDENT_AMBULATORY_CARE_PROVIDER_SITE_OTHER): Payer: Managed Care, Other (non HMO)

## 2018-10-23 DIAGNOSIS — Z1211 Encounter for screening for malignant neoplasm of colon: Secondary | ICD-10-CM

## 2018-10-23 LAB — FECAL OCCULT BLOOD, IMMUNOCHEMICAL: Fecal Occult Bld: NEGATIVE

## 2018-10-23 LAB — FECAL OCCULT BLOOD, GUAIAC: Fecal Occult Blood: NEGATIVE

## 2018-10-24 ENCOUNTER — Encounter: Payer: Self-pay | Admitting: Family Medicine

## 2019-10-10 ENCOUNTER — Other Ambulatory Visit: Payer: Self-pay | Admitting: Family Medicine

## 2019-10-10 ENCOUNTER — Other Ambulatory Visit (INDEPENDENT_AMBULATORY_CARE_PROVIDER_SITE_OTHER): Payer: Managed Care, Other (non HMO)

## 2019-10-10 ENCOUNTER — Other Ambulatory Visit: Payer: Self-pay

## 2019-10-10 DIAGNOSIS — Z125 Encounter for screening for malignant neoplasm of prostate: Secondary | ICD-10-CM | POA: Diagnosis not present

## 2019-10-10 DIAGNOSIS — E785 Hyperlipidemia, unspecified: Secondary | ICD-10-CM | POA: Diagnosis not present

## 2019-10-10 LAB — COMPREHENSIVE METABOLIC PANEL
ALT: 30 U/L (ref 0–53)
AST: 30 U/L (ref 0–37)
Albumin: 4.1 g/dL (ref 3.5–5.2)
Alkaline Phosphatase: 72 U/L (ref 39–117)
BUN: 26 mg/dL — ABNORMAL HIGH (ref 6–23)
CO2: 28 mEq/L (ref 19–32)
Calcium: 9.2 mg/dL (ref 8.4–10.5)
Chloride: 105 mEq/L (ref 96–112)
Creatinine, Ser: 1.48 mg/dL (ref 0.40–1.50)
GFR: 48.9 mL/min — ABNORMAL LOW (ref >60.00)
Glucose, Bld: 86 mg/dL (ref 70–99)
Potassium: 4.3 mEq/L (ref 3.5–5.1)
Sodium: 140 mEq/L (ref 135–145)
Total Bilirubin: 0.5 mg/dL (ref 0.2–1.2)
Total Protein: 6.3 g/dL (ref 6.0–8.3)

## 2019-10-10 LAB — LIPID PANEL
Cholesterol: 182 mg/dL (ref 0–200)
HDL: 66.5 mg/dL (ref >39.00)
LDL Cholesterol: 104 mg/dL — ABNORMAL HIGH (ref 0–99)
NonHDL: 115.25
Total CHOL/HDL Ratio: 3
Triglycerides: 58 mg/dL (ref 0.0–149.0)
VLDL: 11.6 mg/dL (ref 0.0–40.0)

## 2019-10-10 LAB — PSA: PSA: 0.5 ng/mL (ref 0.10–4.00)

## 2019-10-15 ENCOUNTER — Ambulatory Visit (INDEPENDENT_AMBULATORY_CARE_PROVIDER_SITE_OTHER): Payer: Managed Care, Other (non HMO) | Admitting: Family Medicine

## 2019-10-15 ENCOUNTER — Other Ambulatory Visit: Payer: Self-pay

## 2019-10-15 ENCOUNTER — Encounter: Payer: Self-pay | Admitting: Family Medicine

## 2019-10-15 VITALS — BP 120/62 | HR 61 | Temp 97.9°F | Ht 69.5 in | Wt 244.2 lb

## 2019-10-15 DIAGNOSIS — Z0000 Encounter for general adult medical examination without abnormal findings: Secondary | ICD-10-CM

## 2019-10-15 DIAGNOSIS — M545 Low back pain, unspecified: Secondary | ICD-10-CM | POA: Insufficient documentation

## 2019-10-15 DIAGNOSIS — Z1211 Encounter for screening for malignant neoplasm of colon: Secondary | ICD-10-CM | POA: Diagnosis not present

## 2019-10-15 DIAGNOSIS — N289 Disorder of kidney and ureter, unspecified: Secondary | ICD-10-CM | POA: Diagnosis not present

## 2019-10-15 DIAGNOSIS — E785 Hyperlipidemia, unspecified: Secondary | ICD-10-CM | POA: Diagnosis not present

## 2019-10-15 DIAGNOSIS — G8929 Other chronic pain: Secondary | ICD-10-CM

## 2019-10-15 NOTE — Progress Notes (Signed)
This visit was conducted in person.  BP 120/62 (BP Location: Left Arm, Patient Position: Sitting, Cuff Size: Large)   Pulse 61   Temp 97.9 F (36.6 C) (Temporal)   Ht 5' 9.5" (1.765 m)   Wt 244 lb 3 oz (110.8 kg)   SpO2 96%   BMI 35.54 kg/m    CC: CPE Subjective:    Patient ID: Alejandro Decker, male    DOB: 24-Sep-1960, 59 y.o.   MRN: [id number]  HPI: Alejandro Decker is a 59 y.o. male presenting on 10/15/2019 for Annual Exam   Noticing some L>R lower back pain for the last 3 months. Some radiation to L buttock. Denies inciting trauma/injury or falls. Stopping regular workouts at gym helped. Changing to more ergonomic chair at work may have helped. Increasing stretching has helped some. Still with some stiffness but not as much pain. He changed his workout routine as well with benefit. No numbness/paresthesias or weakness of leg. No bowel/bladder accidents or saddle anesthesia.   Preventative: Colon cancer screening - iFOB neg 05/2017  Prostate cancer screening - yearly PSA. No BPH symptom.  Flu yearly at work  Td 2010, Tdap due - will defer shingrix - discussed - will defer Interested in covid vaccine - he is essential worker so eligible Seat belt use discussed Sunscreen use discussed. No suspicious moles noted. Saw derm dx with porokeratosis 05/2018. 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 Interim medical history since our last visit reviewed. Allergies and medications reviewed and updated. Outpatient Medications Prior to Visit  Medication Sig Dispense Refill  . diphenhydrAMINE (BENADRYL) 25 mg capsule Take 50 mg by mouth  at bedtime as needed.     . fexofenadine (ALLEGRA) 180 MG tablet Take 180 mg by mouth daily.    . fluticasone (FLONASE) 50 MCG/ACT nasal spray Place 2 sprays into both nostrils daily. As needed     No facility-administered medications prior to visit.     Per HPI unless specifically indicated in ROS section below Review of Systems  Constitutional: Negative for activity change, appetite change, chills, fatigue, fever and unexpected weight change.  HENT: Negative for hearing loss.   Eyes: Negative for visual disturbance.  Respiratory: Negative for cough, chest tightness, shortness of breath and wheezing.   Cardiovascular: Negative for chest pain, palpitations and leg swelling.  Gastrointestinal: Negative for abdominal distention, abdominal pain, blood in stool, constipation, diarrhea, nausea and vomiting.  Genitourinary: Negative for difficulty urinating and hematuria.  Musculoskeletal: Negative for arthralgias, myalgias and neck pain.  Skin: Negative for rash.  Neurological: Negative for dizziness, seizures, syncope and headaches.  Hematological: Negative for adenopathy. Does not bruise/bleed easily.  Psychiatric/Behavioral: Negative for dysphoric mood. The patient is not nervous/anxious.    Objective:    BP 120/62 (BP Location: Left Arm, Patient Position: Sitting, Cuff Size: Large)   Pulse 61   Temp 97.9 F (36.6 C) (Temporal)   Ht 5' 9.5" (1.765 m)   Wt 244 lb 3 oz (110.8 kg)   SpO2 96%   BMI 35.54 kg/m   Wt Readings from Last 3 Encounters:  10/15/19 244 lb 3 oz (110.8  kg)  10/11/18 242 lb 5 oz (109.9 kg)  09/25/17 242 lb (109.8 kg)    Physical Exam Vitals and nursing note reviewed.  Constitutional:      General: He is not in acute distress.    Appearance: Normal appearance. He is well-developed. He is not ill-appearing.  HENT:     Head: Normocephalic and atraumatic.     Right Ear: Hearing, tympanic membrane, ear canal and external ear normal.     Left Ear: Hearing,  tympanic membrane, ear canal and external ear normal.     Mouth/Throat:     Pharynx: Uvula midline.  Eyes:     General: No scleral icterus.    Extraocular Movements: Extraocular movements intact.     Conjunctiva/sclera: Conjunctivae normal.     Pupils: Pupils are equal, round, and reactive to light.  Cardiovascular:     Rate and Rhythm: Normal rate and regular rhythm.     Pulses: Normal pulses.          Radial pulses are 2+ on the right side and 2+ on the left side.     Heart sounds: Normal heart sounds. No murmur.  Pulmonary:     Effort: Pulmonary effort is normal. No respiratory distress.     Breath sounds: Normal breath sounds. No wheezing, rhonchi or rales.  Abdominal:     General: Abdomen is flat. Bowel sounds are normal. There is no distension.     Palpations: Abdomen is soft. There is no mass.     Tenderness: There is no abdominal tenderness. There is no guarding or rebound.     Hernia: No hernia is present.  Musculoskeletal:        General: Normal range of motion.     Cervical back: Normal range of motion and neck supple.     Right lower leg: No edema.     Left lower leg: No edema.     Comments:  No pain midline spine No paraspinous mm tenderness Neg SLR bilaterally. No pain with int/ext rotation at hip. Neg FABER. No pain at SIJ, GTB or sciatic notch bilaterally.   Lymphadenopathy:     Cervical: No cervical adenopathy.  Skin:    General: Skin is warm and dry.     Findings: No rash.  Neurological:     General: No focal deficit present.     Mental Status: He is alert and oriented to person, place, and time.     Comments:  CN grossly intact, station and gait intact 5/5 strength BLE  Psychiatric:        Mood and Affect: Mood normal.        Behavior: Behavior normal.        Thought Content: Thought content normal.        Judgment: Judgment normal.       Results for orders placed or performed in visit on 10/10/19  PSA  Result Value Ref Range   PSA 0.50 0.10 -  4.00 ng/mL  Comprehensive metabolic panel  Result Value Ref Range   Sodium 140 135 - 145 mEq/L   Potassium 4.3 3.5 - 5.1 mEq/L   Chloride 105 96 - 112 mEq/L   CO2 28 19 - 32 mEq/L   Glucose, Bld 86 70 - 99 mg/dL   BUN 26 (H) 6 - 23 mg/dL   Creatinine, Ser 1.48 0.40 - 1.50 mg/dL   Total Bilirubin 0.5 0.2 - 1.2 mg/dL   Alkaline Phosphatase 72 39 - 117 U/L   AST  30 0 - 37 U/L   ALT 30 0 - 53 U/L   Total Protein 6.3 6.0 - 8.3 g/dL   Albumin 4.1 3.5 - 5.2 g/dL   GFR 48.90 (L) >60.00 mL/min   Calcium 9.2 8.4 - 10.5 mg/dL  Lipid panel  Result Value Ref Range   Cholesterol 182 0 - 200 mg/dL   Triglycerides 58.0 0.0 - 149.0 mg/dL   HDL 66.50 >39.00 mg/dL   VLDL 11.6 0.0 - 40.0 mg/dL   LDL Cholesterol 104 (H) 0 - 99 mg/dL   Total CHOL/HDL Ratio 3    NonHDL 115.25    Assessment & Plan:  This visit occurred during the SARS-CoV-2 public health emergency.  Safety protocols were in place, including screening questions prior to the visit, additional usage of staff PPE, and extensive cleaning of exam room while observing appropriate contact time as indicated for disinfecting solutions.   Problem List Items Addressed This Visit    Severe obesity (BMI 35.0-39.9) with comorbidity ([place])    Reviewed continued healthy diet and lifestyle.  High muscle mass.       Renal insufficiency    Reviewing trend over the last 8 years - Cr ranging from 1.2-1.5. encouraged increasing water intake by at least 16 oz /day. Continue to monitor this yearly.       Relevant Orders   Microalbumin / creatinine urine ratio   Lower back pain    Benign exam today. Anticipate lumbar strain. Supportive care reviewed. Encouraged core strengthening exercises.       HLD (hyperlipidemia)    Chronic, improved with healthier diet this past year  The 10-year ASCVD risk score Mikey Bussing DC Brooke Bonito., et al., 2013) is: 4.3%   Values used to calculate the score:     Age: 76 years     Sex: Male     Is Non-Hispanic African American:  No     Diabetic: No     Tobacco smoker: No     Systolic Blood Pressure: 123456 mmHg     Is BP treated: No     HDL Cholesterol: 66.5 mg/dL     Total Cholesterol: 182 mg/dL       Healthcare maintenance - Primary    Preventative protocols reviewed and updated unless pt declined. Discussed healthy diet and lifestyle.  Discussed recommended immunizations - he is eligible for covid vaccine - so will work towards this prior to getting Tdap, shingrix.        Other Visit Diagnoses    Special screening for malignant neoplasms, colon       Relevant Orders   Fecal occult blood, imunochemical       No orders of the defined types were placed in this encounter.  Orders Placed This Encounter  Procedures  . Fecal occult blood, imunochemical    Standing Status:   Future    Standing Expiration Date:   10/14/2020  . Microalbumin / creatinine urine ratio    Patient instructions: You'll be due for Tdap.  Consider shingrix in the near future.  Look at below website to sign up for covid vaccine: [email or web address] You are doing well today  Return as needed or in 1 year for next physical Urine test today.   Follow up plan: Return in about 1 year (around 10/14/2020) for annual exam, prior fasting for blood work.  Ria Bush, MD

## 2019-10-15 NOTE — Assessment & Plan Note (Signed)
Benign exam today. Anticipate lumbar strain. Supportive care reviewed. Encouraged core strengthening exercises.

## 2019-10-15 NOTE — Assessment & Plan Note (Signed)
Reviewed continued healthy diet and lifestyle.  High muscle mass.

## 2019-10-15 NOTE — Assessment & Plan Note (Signed)
Chronic, improved with healthier diet this past year  The 10-year ASCVD risk score Mikey Bussing DC Brooke Bonito., et al., 2013) is: 4.3%   Values used to calculate the score:     Age: 59 years     Sex: Male     Is Non-Hispanic African American: No     Diabetic: No     Tobacco smoker: No     Systolic Blood Pressure: 123456 mmHg     Is BP treated: No     HDL Cholesterol: 66.5 mg/dL     Total Cholesterol: 182 mg/dL

## 2019-10-15 NOTE — Assessment & Plan Note (Addendum)
Preventative protocols reviewed and updated unless pt declined. Discussed healthy diet and lifestyle.  Discussed recommended immunizations - he is eligible for covid vaccine - so will work towards this prior to getting Tdap, shingrix.

## 2019-10-15 NOTE — Patient Instructions (Addendum)
You'll be due for Tdap.  Consider shingrix in the near future.  Look at below website to sign up for covid vaccine: [email or web address] You are doing well today  Return as needed or in 1 year for next physical Urine test today.   Health Maintenance, Male Adopting a healthy lifestyle and getting preventive care are important in promoting health and wellness. Ask your health care provider about:  The right schedule for you to have regular tests and exams.  Things you can do on your own to prevent diseases and keep yourself healthy. What should I know about diet, weight, and exercise? Eat a healthy diet   Eat a diet that includes plenty of vegetables, fruits, low-fat dairy products, and lean protein.  Do not eat a lot of foods that are high in solid fats, added sugars, or sodium. Maintain a healthy weight Body mass index (BMI) is a measurement that can be used to identify possible weight problems. It estimates body fat based on height and weight. Your health care provider can help determine your BMI and help you achieve or maintain a healthy weight. Get regular exercise Get regular exercise. This is one of the most important things you can do for your health. Most adults should:  Exercise for at least 150 minutes each week. The exercise should increase your heart rate and make you sweat (moderate-intensity exercise).  Do strengthening exercises at least twice a week. This is in addition to the moderate-intensity exercise.  Spend less time sitting. Even light physical activity can be beneficial. Watch cholesterol and blood lipids Have your blood tested for lipids and cholesterol at 58 years of age, then have this test every 5 years. You may need to have your cholesterol levels checked more often if:  Your lipid or cholesterol levels are high.  You are older than 58 years of age.  You are at high risk for heart disease. What should I know about cancer screening? Many types  of cancers can be detected early and may often be prevented. Depending on your health history and family history, you may need to have cancer screening at various ages. This may include screening for:  Colorectal cancer.  Prostate cancer.  Skin cancer.  Lung cancer. What should I know about heart disease, diabetes, and high blood pressure? Blood pressure and heart disease  High blood pressure causes heart disease and increases the risk of stroke. This is more likely to develop in people who have high blood pressure readings, are of African descent, or are overweight.  Talk with your health care provider about your target blood pressure readings.  Have your blood pressure checked: ? Every 3-5 years if you are 17-75 years of age. ? Every year if you are 3 years old or older.  If you are between the ages of 32 and 65 and are a current or former smoker, ask your health care provider if you should have a one-time screening for abdominal aortic aneurysm (AAA). Diabetes Have regular diabetes screenings. This checks your fasting blood sugar level. Have the screening done:  Once every three years after age 27 if you are at a normal weight and have a low risk for diabetes.  More often and at a younger age if you are overweight or have a high risk for diabetes. What should I know about preventing infection? Hepatitis B If you have a higher risk for hepatitis B, you should be screened for this virus. Talk with your health care provider to  find out if you are at risk for hepatitis B infection. Hepatitis C Blood testing is recommended for:  Everyone born from [birth year] through 1965.  Anyone with known risk factors for hepatitis C. Sexually transmitted infections (STIs)  You should be screened each year for STIs, including gonorrhea and chlamydia, if: ? You are sexually active and are younger than 58 years of age. ? You are older than 58 years of age and your health care provider tells you that  you are at risk for this type of infection. ? Your sexual activity has changed since you were last screened, and you are at increased risk for chlamydia or gonorrhea. Ask your health care provider if you are at risk.  Ask your health care provider about whether you are at high risk for HIV. Your health care provider may recommend a prescription medicine to help prevent HIV infection. If you choose to take medicine to prevent HIV, you should first get tested for HIV. You should then be tested every 3 months for as long as you are taking the medicine. Follow these instructions at home: Lifestyle  Do not use any products that contain nicotine or tobacco, such as cigarettes, e-cigarettes, and chewing tobacco. If you need help quitting, ask your health care provider.  Do not use street drugs.  Do not share needles.  Ask your health care provider for help if you need support or information about quitting drugs. Alcohol use  Do not drink alcohol if your health care provider tells you not to drink.  If you drink alcohol: ? Limit how much you have to 0-2 drinks a day. ? Be aware of how much alcohol is in your drink. In the [place] one drink equals one 12 oz bottle of beer (355 mL), one 5 oz glass of wine (148 mL), or one 1 oz glass of hard liquor (44 mL). General instructions  Schedule regular health, dental, and eye exams.  Stay current with your vaccines.  Tell your health care provider if: ? You often feel depressed. ? You have ever been abused or do not feel safe at home. Summary  Adopting a healthy lifestyle and getting preventive care are important in promoting health and wellness.  Follow your health care provider's instructions about healthy diet, exercising, and getting tested or screened for diseases.  Follow your health care provider's instructions on monitoring your cholesterol and blood pressure. This information is not intended to replace advice given to you by your health  care provider. Make sure you discuss any questions you have with your health care provider. Document Revised: 07/17/2018 Document Reviewed: 07/17/2018 Elsevier Patient Education  2020 [employer].

## 2019-10-15 NOTE — Assessment & Plan Note (Signed)
Reviewing trend over the last 8 years - Cr ranging from 1.2-1.5. encouraged increasing water intake by at least 16 oz /day. Continue to monitor this yearly.

## 2019-10-16 LAB — MICROALBUMIN / CREATININE URINE RATIO
Creatinine,U: 213 mg/dL
Microalb Creat Ratio: 0.3 mg/g (ref 0.0–30.0)
Microalb, Ur: <0.7 mg/dL (ref 0.0–1.9)

## 2019-10-28 ENCOUNTER — Other Ambulatory Visit (INDEPENDENT_AMBULATORY_CARE_PROVIDER_SITE_OTHER): Payer: Managed Care, Other (non HMO)

## 2019-10-28 DIAGNOSIS — Z1211 Encounter for screening for malignant neoplasm of colon: Secondary | ICD-10-CM

## 2019-10-28 LAB — FECAL OCCULT BLOOD, IMMUNOCHEMICAL: Fecal Occult Bld: NEGATIVE

## 2019-10-28 LAB — FECAL OCCULT BLOOD, GUAIAC: Fecal Occult Blood: NEGATIVE

## 2019-10-29 ENCOUNTER — Encounter: Payer: Self-pay | Admitting: Family Medicine

## 2020-12-12 ENCOUNTER — Other Ambulatory Visit: Payer: Self-pay | Admitting: Family Medicine

## 2020-12-12 DIAGNOSIS — E785 Hyperlipidemia, unspecified: Secondary | ICD-10-CM

## 2020-12-12 DIAGNOSIS — Z125 Encounter for screening for malignant neoplasm of prostate: Secondary | ICD-10-CM

## 2020-12-12 DIAGNOSIS — N289 Disorder of kidney and ureter, unspecified: Secondary | ICD-10-CM

## 2020-12-13 ENCOUNTER — Other Ambulatory Visit: Payer: Self-pay

## 2020-12-13 ENCOUNTER — Other Ambulatory Visit (INDEPENDENT_AMBULATORY_CARE_PROVIDER_SITE_OTHER): Payer: BC Managed Care – PPO

## 2020-12-13 DIAGNOSIS — E785 Hyperlipidemia, unspecified: Secondary | ICD-10-CM

## 2020-12-13 DIAGNOSIS — Z125 Encounter for screening for malignant neoplasm of prostate: Secondary | ICD-10-CM

## 2020-12-13 DIAGNOSIS — N289 Disorder of kidney and ureter, unspecified: Secondary | ICD-10-CM | POA: Diagnosis not present

## 2020-12-13 LAB — CBC WITH DIFFERENTIAL/PLATELET
Basophils Absolute: 0.1 10*3/uL (ref 0.0–0.1)
Basophils Relative: 1.2 % (ref 0.0–3.0)
Eosinophils Absolute: 0.2 10*3/uL (ref 0.0–0.7)
Eosinophils Relative: 3.9 % (ref 0.0–5.0)
HCT: 45.5 % (ref 39.0–52.0)
Hemoglobin: 15.4 g/dL (ref 13.0–17.0)
Lymphocytes Relative: 26.4 % (ref 12.0–46.0)
Lymphs Abs: 1.6 10*3/uL (ref 0.7–4.0)
MCHC: 33.8 g/dL (ref 30.0–36.0)
MCV: 91.4 fl (ref 78.0–100.0)
Monocytes Absolute: 0.4 10*3/uL (ref 0.1–1.0)
Monocytes Relative: 6.5 % (ref 3.0–12.0)
Neutro Abs: 3.8 10*3/uL (ref 1.4–7.7)
Neutrophils Relative %: 62 % (ref 43.0–77.0)
Platelets: 231 10*3/uL (ref 150.0–400.0)
RBC: 4.98 Mil/uL (ref 4.22–5.81)
RDW: 12.8 % (ref 11.5–15.5)
WBC: 6.2 10*3/uL (ref 4.0–10.5)

## 2020-12-13 LAB — LIPID PANEL
Cholesterol: 199 mg/dL (ref 0–200)
HDL: 77 mg/dL (ref >39.00)
LDL Cholesterol: 105 mg/dL — ABNORMAL HIGH (ref 0–99)
NonHDL: 122.35
Total CHOL/HDL Ratio: 3
Triglycerides: 89 mg/dL (ref 0.0–149.0)
VLDL: 17.8 mg/dL (ref 0.0–40.0)

## 2020-12-13 LAB — VITAMIN D 25 HYDROXY (VIT D DEFICIENCY, FRACTURES): VITD: 22.12 ng/mL — ABNORMAL LOW (ref 30.00–100.00)

## 2020-12-13 LAB — COMPREHENSIVE METABOLIC PANEL
ALT: 28 U/L (ref 0–53)
AST: 25 U/L (ref 0–37)
Albumin: 4.2 g/dL (ref 3.5–5.2)
Alkaline Phosphatase: 76 U/L (ref 39–117)
BUN: 23 mg/dL (ref 6–23)
CO2: 28 mEq/L (ref 19–32)
Calcium: 9 mg/dL (ref 8.4–10.5)
Chloride: 104 mEq/L (ref 96–112)
Creatinine, Ser: 1.53 mg/dL — ABNORMAL HIGH (ref 0.40–1.50)
GFR: 49.73 mL/min — ABNORMAL LOW (ref >60.00)
Glucose, Bld: 88 mg/dL (ref 70–99)
Potassium: 4.5 mEq/L (ref 3.5–5.1)
Sodium: 141 mEq/L (ref 135–145)
Total Bilirubin: 0.8 mg/dL (ref 0.2–1.2)
Total Protein: 6.2 g/dL (ref 6.0–8.3)

## 2020-12-13 LAB — PSA: PSA: 0.61 ng/mL (ref 0.10–4.00)

## 2020-12-22 ENCOUNTER — Encounter: Payer: Self-pay | Admitting: Family Medicine

## 2020-12-22 ENCOUNTER — Ambulatory Visit (INDEPENDENT_AMBULATORY_CARE_PROVIDER_SITE_OTHER): Payer: BC Managed Care – PPO | Admitting: Family Medicine

## 2020-12-22 ENCOUNTER — Other Ambulatory Visit: Payer: Self-pay

## 2020-12-22 VITALS — BP 126/74 | HR 64 | Temp 97.9°F | Ht 69.5 in | Wt 247.0 lb

## 2020-12-22 DIAGNOSIS — Z23 Encounter for immunization: Secondary | ICD-10-CM

## 2020-12-22 DIAGNOSIS — Z0000 Encounter for general adult medical examination without abnormal findings: Secondary | ICD-10-CM | POA: Diagnosis not present

## 2020-12-22 DIAGNOSIS — N289 Disorder of kidney and ureter, unspecified: Secondary | ICD-10-CM | POA: Diagnosis not present

## 2020-12-22 DIAGNOSIS — Z1211 Encounter for screening for malignant neoplasm of colon: Secondary | ICD-10-CM

## 2020-12-22 DIAGNOSIS — E785 Hyperlipidemia, unspecified: Secondary | ICD-10-CM | POA: Diagnosis not present

## 2020-12-22 NOTE — Progress Notes (Signed)
Patient ID: Alejandro Decker, male    DOB: [birth year], 60 y.o.   MRN: [id number]  This visit was conducted in person.  BP 126/74   Pulse 64   Temp 97.9 F (36.6 C) (Temporal)   Ht 5' 9.5" (1.765 m)   Wt 247 lb (112 kg)   SpO2 95%   BMI 35.95 kg/m    CC: CPE  Subjective:   HPI: Alejandro Decker is a 60 y.o. male presenting on 12/22/2020 for Annual Exam   Preventative: Colon cancer screening -iFOB neg yearly  Prostate cancer screening - yearly PSA. No BPH symptom.  Fluyearlyat work COVID vaccine J&J 11/2019, Moderna booster 06/2020  Td2010, Tdap today  shingrix - discussed - interested to start today  Seat belt use discussed Sunscreen use discussed. No suspicious moles noted. Previously saw derm dx with porokeratosis 05/2018.  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 Interim medical history since our last visit reviewed. Allergies and medications reviewed and updated. Outpatient Medications Prior to Visit  Medication Sig Dispense Refill  . cetirizine (ZYRTEC) 10 MG tablet Take 10 mg by mouth daily.    . diphenhydrAMINE (BENADRYL) 25 mg capsule Take 50 mg by mouth at bedtime as needed.    . fexofenadine (ALLEGRA) 180 MG tablet Take 180 mg by mouth daily.     No facility-administered medications prior to visit.     Per HPI unless specifically indicated in ROS section below Review of Systems  Constitutional: Negative for activity change, appetite change, chills, fatigue, fever and unexpected weight change.  HENT: Negative for hearing loss.   Eyes: Negative for visual disturbance.  Respiratory: Negative  for cough, chest tightness, shortness of breath and wheezing.   Cardiovascular: Negative for chest pain, palpitations and leg swelling.  Gastrointestinal: Negative for abdominal distention, abdominal pain, blood in stool, constipation, diarrhea, nausea and vomiting.  Genitourinary: Negative for difficulty urinating and hematuria.  Musculoskeletal: Negative for arthralgias, myalgias and neck pain.  Skin: Negative for rash.  Neurological: Negative for dizziness, seizures, syncope and headaches.  Hematological: Negative for adenopathy. Does not bruise/bleed easily.  Psychiatric/Behavioral: Negative for dysphoric mood. The patient is not nervous/anxious.    Objective:  BP 126/74   Pulse 64   Temp 97.9 F (36.6 C) (Temporal)   Ht 5' 9.5" (1.765 m)   Wt 247 lb (112 kg)   SpO2 95%   BMI 35.95 kg/m   Wt Readings from Last 3 Encounters:  12/22/20 247 lb (112 kg)  10/15/19 244 lb 3 oz (110.8 kg)  10/11/18 242 lb 5 oz (109.9 kg)      Physical Exam Vitals and nursing note reviewed.  Constitutional:      General: He is not in acute distress.    Appearance: Normal appearance. He is well-developed. He is not ill-appearing.  HENT:     Head: Normocephalic and atraumatic.     Right Ear: Hearing, tympanic membrane, ear canal and external ear normal.     Left Ear: Hearing, tympanic membrane, ear canal and external ear normal.  Eyes:     General: No scleral icterus.  Extraocular Movements: Extraocular movements intact.     Conjunctiva/sclera: Conjunctivae normal.     Pupils: Pupils are equal, round, and reactive to light.  Neck:     Thyroid: No thyroid mass or thyromegaly.  Cardiovascular:     Rate and Rhythm: Normal rate and regular rhythm.     Pulses: Normal pulses.          Radial pulses are 2+ on the right side and 2+ on the left side.     Heart sounds: Normal heart sounds. No murmur heard.   Pulmonary:     Effort: Pulmonary effort is normal. No respiratory distress.     Breath  sounds: Normal breath sounds. No wheezing, rhonchi or rales.  Abdominal:     General: Bowel sounds are normal. There is no distension.     Palpations: Abdomen is soft. There is no mass.     Tenderness: There is no abdominal tenderness. There is no guarding or rebound.     Hernia: No hernia is present.  Musculoskeletal:        General: Normal range of motion.     Cervical back: Normal range of motion and neck supple.     Right lower leg: No edema.     Left lower leg: No edema.  Lymphadenopathy:     Cervical: No cervical adenopathy.  Skin:    General: Skin is warm and dry.     Findings: No rash.  Neurological:     General: No focal deficit present.     Mental Status: He is alert and oriented to person, place, and time.     Comments: CN grossly intact, station and gait intact  Psychiatric:        Mood and Affect: Mood normal.        Behavior: Behavior normal.        Thought Content: Thought content normal.        Judgment: Judgment normal.       Results for orders placed or performed in visit on 12/13/20  VITAMIN D 25 Hydroxy (Vit-D Deficiency, Fractures)  Result Value Ref Range   VITD 22.12 (L) 30.00 - 100.00 ng/mL  CBC with Differential/Platelet  Result Value Ref Range   WBC 6.2 4.0 - 10.5 K/uL   RBC 4.98 4.22 - 5.81 Mil/uL   Hemoglobin 15.4 13.0 - 17.0 g/dL   HCT 45.5 39.0 - 52.0 %   MCV 91.4 78.0 - 100.0 fl   MCHC 33.8 30.0 - 36.0 g/dL   RDW 12.8 11.5 - 15.5 %   Platelets 231.0 150.0 - 400.0 K/uL   Neutrophils Relative % 62.0 43.0 - 77.0 %   Lymphocytes Relative 26.4 12.0 - 46.0 %   Monocytes Relative 6.5 3.0 - 12.0 %   Eosinophils Relative 3.9 0.0 - 5.0 %   Basophils Relative 1.2 0.0 - 3.0 %   Neutro Abs 3.8 1.4 - 7.7 K/uL   Lymphs Abs 1.6 0.7 - 4.0 K/uL   Monocytes Absolute 0.4 0.1 - 1.0 K/uL   Eosinophils Absolute 0.2 0.0 - 0.7 K/uL   Basophils Absolute 0.1 0.0 - 0.1 K/uL  PSA  Result Value Ref Range   PSA 0.61 0.10 - 4.00 ng/mL  Lipid panel  Result Value  Ref Range   Cholesterol 199 0 - 200 mg/dL   Triglycerides 89.0 0.0 - 149.0 mg/dL   HDL 77.00 >39.00 mg/dL   VLDL 17.8 0.0 - 40.0 mg/dL   LDL Cholesterol 105 (H) 0 - 99 mg/dL  Total CHOL/HDL Ratio 3    NonHDL 122.35   Comprehensive metabolic panel  Result Value Ref Range   Sodium 141 135 - 145 mEq/L   Potassium 4.5 3.5 - 5.1 mEq/L   Chloride 104 96 - 112 mEq/L   CO2 28 19 - 32 mEq/L   Glucose, Bld 88 70 - 99 mg/dL   BUN 23 6 - 23 mg/dL   Creatinine, Ser 1.53 (H) 0.40 - 1.50 mg/dL   Total Bilirubin 0.8 0.2 - 1.2 mg/dL   Alkaline Phosphatase 76 39 - 117 U/L   AST 25 0 - 37 U/L   ALT 28 0 - 53 U/L   Total Protein 6.2 6.0 - 8.3 g/dL   Albumin 4.2 3.5 - 5.2 g/dL   GFR 49.73 (L) >60.00 mL/min   Calcium 9.0 8.4 - 10.5 mg/dL   Assessment & Plan:  This visit occurred during the SARS-CoV-2 public health emergency.  Safety protocols were in place, including screening questions prior to the visit, additional usage of staff PPE, and extensive cleaning of exam room while observing appropriate contact time as indicated for disinfecting solutions.   Problem List Items Addressed This Visit    HLD (hyperlipidemia)    Mild, off meds. Total cholesterol predominantly driven by high HDL levels. The 10-year ASCVD risk score Mikey Bussing DC Brooke Bonito., et al., 2013) is: 5%   Values used to calculate the score:     Age: 60 years     Sex: Male     Is Non-Hispanic African American: No     Diabetic: No     Tobacco smoker: No     Systolic Blood Pressure: 449 mmHg     Is BP treated: No     HDL Cholesterol: 77 mg/dL     Total Cholesterol: 199 mg/dL       Healthcare maintenance - Primary    Preventative protocols reviewed and updated unless pt declined. Discussed healthy diet and lifestyle.       Renal insufficiency    Cr ranging 1.2-1.5 over the last 15 years.  Check Umicroalb today.  Encouraged good water intake.  High muscle mass predisposes to elevated Cr.      Relevant Orders   Microalbumin /  creatinine urine ratio    Other Visit Diagnoses    Need for Tdap vaccination       Relevant Orders   Tdap vaccine greater than or equal to 7yo IM (Completed)   Need for shingles vaccine       Relevant Orders   Varicella-zoster vaccine IM (Completed)   Special screening for malignant neoplasms, colon       Relevant Orders   Fecal occult blood, imunochemical       No orders of the defined types were placed in this encounter.  Orders Placed This Encounter  Procedures  . Fecal occult blood, imunochemical    Standing Status:   Future    Standing Expiration Date:   12/22/2021  . Tdap vaccine greater than or equal to 7yo IM  . Varicella-zoster vaccine IM  . Microalbumin / creatinine urine ratio    Patient instructions: Pass by lab to pick up stool kit.  Tdap (tetanus/whooping cough) and first shingrix vaccine today. Schedule nurse visit in 2-6 months to complete shingles series.  Leave [place] urine sample to check protein levels in the urine.  You are doing well today Return as needed or in 1 year for next physical  Follow up plan: Return in about 1 year (  around 12/22/2021) for annual exam, prior fasting for blood work.  Ria Bush, MD

## 2020-12-22 NOTE — Patient Instructions (Addendum)
Pass by lab to pick up stool kit.  Tdap (tetanus/whooping cough) and first shingrix vaccine today. Schedule nurse visit in 2-6 months to complete shingles series.  Leave [place] urine sample to check protein levels in the urine.  You are doing well today Return as needed or in 1 year for next physical  Health Maintenance, Male Adopting a healthy lifestyle and getting preventive care are important in promoting health and wellness. Ask your health care provider about:  The right schedule for you to have regular tests and exams.  Things you can do on your own to prevent diseases and keep yourself healthy. What should I know about diet, weight, and exercise? Eat a healthy diet  Eat a diet that includes plenty of vegetables, fruits, low-fat dairy products, and lean protein.  Do not eat a lot of foods that are high in solid fats, added sugars, or sodium.   Maintain a healthy weight Body mass index (BMI) is a measurement that can be used to identify possible weight problems. It estimates body fat based on height and weight. Your health care provider can help determine your BMI and help you achieve or maintain a healthy weight. Get regular exercise Get regular exercise. This is one of the most important things you can do for your health. Most adults should:  Exercise for at least 150 minutes each week. The exercise should increase your heart rate and make you sweat (moderate-intensity exercise).  Do strengthening exercises at least twice a week. This is in addition to the moderate-intensity exercise.  Spend less time sitting. Even light physical activity can be beneficial. Watch cholesterol and blood lipids Have your blood tested for lipids and cholesterol at 59 years of age, then have this test every 5 years. You may need to have your cholesterol levels checked more often if:  Your lipid or cholesterol levels are high.  You are older than 59 years of age.  You are at high risk for heart  disease. What should I know about cancer screening? Many types of cancers can be detected early and may often be prevented. Depending on your health history and family history, you may need to have cancer screening at various ages. This may include screening for:  Colorectal cancer.  Prostate cancer.  Skin cancer.  Lung cancer. What should I know about heart disease, diabetes, and high blood pressure? Blood pressure and heart disease  High blood pressure causes heart disease and increases the risk of stroke. This is more likely to develop in people who have high blood pressure readings, are of African descent, or are overweight.  Talk with your health care provider about your target blood pressure readings.  Have your blood pressure checked: ? Every 3-5 years if you are 12-83 years of age. ? Every year if you are 40 years old or older.  If you are between the ages of 65 and 39 and are a current or former smoker, ask your health care provider if you should have a one-time screening for abdominal aortic aneurysm (AAA). Diabetes Have regular diabetes screenings. This checks your fasting blood sugar level. Have the screening done:  Once every three years after age 35 if you are at a normal weight and have a low risk for diabetes.  More often and at a younger age if you are overweight or have a high risk for diabetes. What should I know about preventing infection? Hepatitis B If you have a higher risk for hepatitis B, you should  be screened for this virus. Talk with your health care provider to find out if you are at risk for hepatitis B infection. Hepatitis C Blood testing is recommended for:  Everyone born from [birth year] through 1964.  Anyone with known risk factors for hepatitis C. Sexually transmitted infections (STIs)  You should be screened each year for STIs, including gonorrhea and chlamydia, if: ? You are sexually active and are younger than 59 years of age. ? You are older  than 59 years of age and your health care provider tells you that you are at risk for this type of infection. ? Your sexual activity has changed since you were last screened, and you are at increased risk for chlamydia or gonorrhea. Ask your health care provider if you are at risk.  Ask your health care provider about whether you are at high risk for HIV. Your health care provider may recommend a prescription medicine to help prevent HIV infection. If you choose to take medicine to prevent HIV, you should first get tested for HIV. You should then be tested every 3 months for as long as you are taking the medicine. Follow these instructions at home: Lifestyle  Do not use any products that contain nicotine or tobacco, such as cigarettes, e-cigarettes, and chewing tobacco. If you need help quitting, ask your health care provider.  Do not use street drugs.  Do not share needles.  Ask your health care provider for help if you need support or information about quitting drugs. Alcohol use  Do not drink alcohol if your health care provider tells you not to drink.  If you drink alcohol: ? Limit how much you have to 0-2 drinks a day. ? Be aware of how much alcohol is in your drink. In the [place] one drink equals one 12 oz bottle of beer (355 mL), one 5 oz glass of wine (148 mL), or one 1 oz glass of hard liquor (44 mL). General instructions  Schedule regular health, dental, and eye exams.  Stay current with your vaccines.  Tell your health care provider if: ? You often feel depressed. ? You have ever been abused or do not feel safe at home. Summary  Adopting a healthy lifestyle and getting preventive care are important in promoting health and wellness.  Follow your health care provider's instructions about healthy diet, exercising, and getting tested or screened for diseases.  Follow your health care provider's instructions on monitoring your cholesterol and blood pressure. This  information is not intended to replace advice given to you by your health care provider. Make sure you discuss any questions you have with your health care provider. Document Revised: 07/17/2018 Document Reviewed: 07/17/2018 Elsevier Patient Education  2021 [employer].

## 2020-12-22 NOTE — Assessment & Plan Note (Signed)
Preventative protocols reviewed and updated unless pt declined. Discussed healthy diet and lifestyle.  

## 2020-12-22 NOTE — Assessment & Plan Note (Addendum)
Cr ranging 1.2-1.5 over the last 15 years.  Check Umicroalb today.  Encouraged good water intake.  High muscle mass predisposes to elevated Cr.

## 2020-12-22 NOTE — Assessment & Plan Note (Signed)
Mild, off meds. Total cholesterol predominantly driven by high HDL levels. The 10-year ASCVD risk score Mikey Bussing DC Brooke Bonito., et al., 2013) is: 5%   Values used to calculate the score:     Age: 60 years     Sex: Male     Is Non-Hispanic African American: No     Diabetic: No     Tobacco smoker: No     Systolic Blood Pressure: 492 mmHg     Is BP treated: No     HDL Cholesterol: 77 mg/dL     Total Cholesterol: 199 mg/dL

## 2020-12-23 LAB — MICROALBUMIN / CREATININE URINE RATIO
Creatinine,U: 141.2 mg/dL
Microalb Creat Ratio: 0.5 mg/g (ref 0.0–30.0)
Microalb, Ur: <0.7 mg/dL (ref 0.0–1.9)

## 2020-12-28 ENCOUNTER — Other Ambulatory Visit (INDEPENDENT_AMBULATORY_CARE_PROVIDER_SITE_OTHER): Payer: BC Managed Care – PPO

## 2020-12-28 DIAGNOSIS — Z1211 Encounter for screening for malignant neoplasm of colon: Secondary | ICD-10-CM | POA: Diagnosis not present

## 2020-12-28 LAB — FECAL OCCULT BLOOD, GUAIAC: Fecal Occult Blood: NEGATIVE

## 2020-12-28 LAB — FECAL OCCULT BLOOD, IMMUNOCHEMICAL: Fecal Occult Bld: NEGATIVE

## 2020-12-29 ENCOUNTER — Encounter: Payer: Self-pay | Admitting: Family Medicine

## 2021-01-05 DIAGNOSIS — I8290 Acute embolism and thrombosis of unspecified vein: Secondary | ICD-10-CM | POA: Insufficient documentation

## 2021-01-05 DIAGNOSIS — Z86718 Personal history of other venous thrombosis and embolism: Secondary | ICD-10-CM | POA: Insufficient documentation

## 2021-01-05 HISTORY — DX: Acute embolism and thrombosis of unspecified vein: I82.90

## 2021-01-05 HISTORY — PX: PULMONARY THROMBECTOMY: CATH118295

## 2021-01-11 ENCOUNTER — Emergency Department: Payer: BC Managed Care – PPO

## 2021-01-11 ENCOUNTER — Other Ambulatory Visit: Payer: Self-pay

## 2021-01-11 ENCOUNTER — Inpatient Hospital Stay
Admission: EM | Admit: 2021-01-11 | Discharge: 2021-01-13 | DRG: 164 | Disposition: A | Discharge status: Home / Self Care | Payer: BC Managed Care – PPO | Attending: Internal Medicine | Admitting: Internal Medicine

## 2021-01-11 ENCOUNTER — Encounter: Payer: Self-pay | Admitting: *Deleted

## 2021-01-11 DIAGNOSIS — I4510 Unspecified right bundle-branch block: Secondary | ICD-10-CM | POA: Diagnosis present

## 2021-01-11 DIAGNOSIS — R0902 Hypoxemia: Secondary | ICD-10-CM | POA: Diagnosis not present

## 2021-01-11 DIAGNOSIS — Z86711 Personal history of pulmonary embolism: Secondary | ICD-10-CM | POA: Diagnosis present

## 2021-01-11 DIAGNOSIS — I82431 Acute embolism and thrombosis of right popliteal vein: Secondary | ICD-10-CM | POA: Diagnosis not present

## 2021-01-11 DIAGNOSIS — Z823 Family history of stroke: Secondary | ICD-10-CM | POA: Diagnosis not present

## 2021-01-11 DIAGNOSIS — I82811 Embolism and thrombosis of superficial veins of right lower extremities: Secondary | ICD-10-CM | POA: Diagnosis not present

## 2021-01-11 DIAGNOSIS — Z8249 Family history of ischemic heart disease and other diseases of the circulatory system: Secondary | ICD-10-CM | POA: Diagnosis not present

## 2021-01-11 DIAGNOSIS — Z6835 Body mass index (BMI) 35.0-35.9, adult: Secondary | ICD-10-CM | POA: Diagnosis not present

## 2021-01-11 DIAGNOSIS — I2699 Other pulmonary embolism without acute cor pulmonale: Secondary | ICD-10-CM | POA: Diagnosis not present

## 2021-01-11 DIAGNOSIS — E785 Hyperlipidemia, unspecified: Secondary | ICD-10-CM | POA: Diagnosis not present

## 2021-01-11 DIAGNOSIS — Z833 Family history of diabetes mellitus: Secondary | ICD-10-CM | POA: Diagnosis not present

## 2021-01-11 DIAGNOSIS — N179 Acute kidney failure, unspecified: Secondary | ICD-10-CM | POA: Diagnosis present

## 2021-01-11 DIAGNOSIS — M7989 Other specified soft tissue disorders: Secondary | ICD-10-CM

## 2021-01-11 DIAGNOSIS — K219 Gastro-esophageal reflux disease without esophagitis: Secondary | ICD-10-CM | POA: Diagnosis present

## 2021-01-11 DIAGNOSIS — Z79899 Other long term (current) drug therapy: Secondary | ICD-10-CM

## 2021-01-11 DIAGNOSIS — I2721 Secondary pulmonary arterial hypertension: Secondary | ICD-10-CM | POA: Diagnosis not present

## 2021-01-11 DIAGNOSIS — Z820 Family history of epilepsy and other diseases of the nervous system: Secondary | ICD-10-CM

## 2021-01-11 DIAGNOSIS — N1831 Chronic kidney disease, stage 3a: Secondary | ICD-10-CM | POA: Diagnosis present

## 2021-01-11 DIAGNOSIS — E669 Obesity, unspecified: Secondary | ICD-10-CM | POA: Diagnosis not present

## 2021-01-11 DIAGNOSIS — R600 Localized edema: Secondary | ICD-10-CM | POA: Diagnosis not present

## 2021-01-11 DIAGNOSIS — I2609 Other pulmonary embolism with acute cor pulmonale: Secondary | ICD-10-CM | POA: Diagnosis not present

## 2021-01-11 DIAGNOSIS — R0602 Shortness of breath: Secondary | ICD-10-CM | POA: Diagnosis not present

## 2021-01-11 DIAGNOSIS — Z20822 Contact with and (suspected) exposure to covid-19: Secondary | ICD-10-CM | POA: Diagnosis present

## 2021-01-11 DIAGNOSIS — I5189 Other ill-defined heart diseases: Secondary | ICD-10-CM | POA: Diagnosis not present

## 2021-01-11 DIAGNOSIS — R0600 Dyspnea, unspecified: Secondary | ICD-10-CM | POA: Diagnosis not present

## 2021-01-11 LAB — CBC
HCT: 40.5 % (ref 39.0–52.0)
Hemoglobin: 14 g/dL (ref 13.0–17.0)
MCH: 31.5 pg (ref 26.0–34.0)
MCHC: 34.6 g/dL (ref 30.0–36.0)
MCV: 91.2 fL (ref 80.0–100.0)
Platelets: 225 10*3/uL (ref 150–400)
RBC: 4.44 MIL/uL (ref 4.22–5.81)
RDW: 12.3 % (ref 11.5–15.5)
WBC: 8.5 10*3/uL (ref 4.0–10.5)
nRBC: 0 % (ref 0.0–0.2)

## 2021-01-11 LAB — TROPONIN I (HIGH SENSITIVITY): Troponin I (High Sensitivity): 29 ng/L — ABNORMAL HIGH (ref <18)

## 2021-01-11 LAB — BASIC METABOLIC PANEL
Anion gap: 10 (ref 5–15)
BUN: 20 mg/dL (ref 6–20)
CO2: 21 mmol/L — ABNORMAL LOW (ref 22–32)
Calcium: 8.5 mg/dL — ABNORMAL LOW (ref 8.9–10.3)
Chloride: 104 mmol/L (ref 98–111)
Creatinine, Ser: 1.36 mg/dL — ABNORMAL HIGH (ref 0.61–1.24)
GFR, Estimated: >60 mL/min (ref >60)
Glucose, Bld: 94 mg/dL (ref 70–99)
Potassium: 4 mmol/L (ref 3.5–5.1)
Sodium: 135 mmol/L (ref 135–145)

## 2021-01-11 MED ORDER — SODIUM CHLORIDE 0.9 % IV BOLUS
1000.0000 mL | Freq: Once | INTRAVENOUS | Status: AC
  Administered 2021-01-12: 1000 mL via INTRAVENOUS

## 2021-01-11 NOTE — ED Triage Notes (Signed)
Pt has pain and swelling to right lower leg and calf   Sx for 3 days.  Pt also report sob.  No chest pain.  No cough.  Pt alert  Speech clear

## 2021-01-11 NOTE — ED Provider Notes (Signed)
[facility] Emergency Department Provider Note   ____________________________________________   Event Date/Time   First MD Initiated Contact with Patient 01/11/21 2320     (approximate)  I have reviewed the triage vital signs and the nursing notes.   HISTORY  Chief Complaint Leg Swelling    HPI Alejandro Decker is a 60 y.o. male who presents to the ED from home with a chief complaint of right leg pain and swelling.  Patient reports a 3-day history of pain and swelling to his right lower leg and calf.  Denies fall/injury/trauma.  Denies hormone use.  Denies travel history.  Also endorses shortness of breath for 3 weeks.  Denies fever, cough, chest pain, abdominal pain, nausea, vomiting or dizziness.     Past Medical History:  Diagnosis Date  . GERD (gastroesophageal reflux disease)   . Hyperlipemia    statin caused bump in LFTs, saw cards, rec treatment if LDL >130  . 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 Renal insufficiency 10/15/2019  . Lower back pain 10/15/2019  . Porokeratosis 10/11/2018  . Severe obesity (BMI 35.0-39.9) with comorbidity ([place]) 09/07/2014  . Dyspnea on exertion 04/02/2014  . Skin rash 09/17/2013  . Superficial thrombophlebitis of right leg 03/11/2013  . Healthcare maintenance 03/14/2012  . HLD (hyperlipidemia) 12/20/2007  . Allergic rhinitis 11/27/2006    Past Surgical History:  Procedure Laterality Date  . CARDIOVASCULAR STRESS TEST  05/2015   ETT - WNL, EF 58% (Dr Einar Gip)  . CT CTA CORONARY W/CA SCORE W/CM &/OR WO/CM  05/2015   score of 6  . LASIK  11/17/2009   Dr. Gaspar Bidding, [place], [facility]  . TONSILLECTOMY  7 yoa  . US ECHOCARDIOGRAPHY  05/2015   mild concentric LVH, EF 66%, mildly dilated aortic root 4.1cm    Prior to Admission medications   Medication Sig Start Date End Date Taking? Authorizing Provider  cetirizine (ZYRTEC) 10 MG tablet Take 10 mg by mouth daily.     [provider]  diphenhydrAMINE (BENADRYL) 25 mg capsule Take 50 mg by mouth at bedtime as needed.    [provider]    Allergies Patient has no known allergies.  Family History  Problem Relation Age of Onset  . Benign prostatic hyperplasia Father   . Diabetes Maternal Grandfather   . Heart failure Maternal Grandfather   . Parkinsonism Paternal Grandmother   . Stroke Paternal Grandfather   . Coronary artery disease Neg Hx   . Cancer Neg Hx     Social History Social History   Tobacco Use  . Smoking status: Never Smoker  . Smokeless tobacco: Never Used  Substance Use Topics  . Alcohol use: Yes    Alcohol/week: 6.0 standard drinks    Types: 6 drink(s) per week    Comment: weekly  . Drug use: No    Review of Systems  Constitutional: No fever/chills Eyes: No visual changes. ENT: No sore throat. Cardiovascular: Denies chest pain. Respiratory: Denies shortness of breath. Gastrointestinal: No abdominal pain.  No nausea, no vomiting.  No diarrhea.  No constipation. Genitourinary: Negative for dysuria. Musculoskeletal: Positive for right leg pain and swelling.  Negative for back pain. Skin: Negative for rash. Neurological: Negative for headaches, focal weakness or numbness.   ____________________________________________   PHYSICAL EXAM:  VITAL SIGNS: ED Triage Vitals  Enc Vitals Group     BP 01/11/21 2056 134/90     Pulse Rate  01/11/21 2056 69     Resp 01/11/21 2056 18     Temp 01/11/21 2056 98.4 F (36.9 C)     Temp Source 01/11/21 2056 Oral     SpO2 01/11/21 2056 96 %     Weight 01/11/21 2057 240 lb (108.9 kg)     Height 01/11/21 2057 5\' 10"  (1.778 m)     Head Circumference --      Peak Flow --      Pain Score 01/11/21 2057 4     Pain Loc --      Pain Edu? --      Excl. in [place]? --     Constitutional: Alert and oriented. Well appearing and in no acute distress. Eyes: Conjunctivae are normal. PERRL. EOMI. Head: Atraumatic. Nose: No  congestion/rhinnorhea. Mouth/Throat: Mucous membranes are moist.   Neck: No stridor.   Cardiovascular: Normal rate, regular rhythm. Grossly normal heart sounds.  Good peripheral circulation. Respiratory: Normal respiratory effort.  No retractions. Lungs CTAB. Gastrointestinal: Soft and nontender. No distention. No abdominal bruits. No CVA tenderness. Musculoskeletal:  RLE: Calf and lower leg with moderate swelling.  1+ pitting edema.  Positive Homans.  2+ distal pulses.  Brisk, less than 5-second capillary refill. Neurologic:  Normal speech and language. No gross focal neurologic deficits are appreciated. No gait instability. Skin:  Skin is warm, dry and intact. No rash noted. Psychiatric: Mood and affect are normal. Speech and behavior are normal.  ____________________________________________   LABS (all labs ordered are listed, but only abnormal results are displayed)  Labs Reviewed  BASIC METABOLIC PANEL - Abnormal; Notable for the following components:      Result Value   CO2 21 (*)    Creatinine, Ser 1.36 (*)    Calcium 8.5 (*)    All other components within normal limits  TROPONIN I (HIGH SENSITIVITY) - Abnormal; Notable for the following components:   Troponin I (High Sensitivity) 29 (*)    All other components within normal limits  TROPONIN I (HIGH SENSITIVITY) - Abnormal; Notable for the following components:   Troponin I (High Sensitivity) 27 (*)    All other components within normal limits  RESP PANEL BY RT-PCR (FLU A&B, COVID) ARPGX2  CBC   ____________________________________________  EKG  ED ECG REPORT I, Elaijah Munoz J, the attending physician, personally viewed and interpreted this ECG.   Date: 01/11/2021  EKG Time: 2059  Rate: 75  Rhythm: normal EKG, normal sinus rhythm  Axis: Normal  Intervals:none  ST&T Change: Nonspecific  ____________________________________________  RADIOLOGY I, Venna Berberich J, personally viewed and evaluated these images (plain  radiographs) as part of my medical decision making, as well as reviewing the written report by the radiologist.  ED MD interpretation: No acute cardiopulmonary process; DVT involving popliteal and calf veins, superficial thrombus in saphenous vein from thigh to the calf; CTA demonstrates acute PE with large embolic burden with enlargement of central pulmonary arteries in keeping with pulmonary arterial hypertension; no CT evidence of right heart strain  Official radiology report(s): DG Chest 2 View  Result Date: 01/11/2021 CLINICAL DATA:  Pain and swelling of right lower leg and calf. Shortness of breath. EXAM: CHEST - 2 VIEW COMPARISON:  06/08/2014 FINDINGS: The heart size appears normal no pleural effusion or edema. No airspace opacities. Spondylosis identified throughout the mid and lower thoracic spine. IMPRESSION: No active cardiopulmonary abnormalities. Electronically Signed   By: Kerby Moors M.D.   On: 01/11/2021 21:30   CT Angio Chest PE W/Cm &/Or  Wo Cm  Result Date: 01/12/2021 CLINICAL DATA:  Dyspnea EXAM: CT ANGIOGRAPHY CHEST WITH CONTRAST TECHNIQUE: Multidetector CT imaging of the chest was performed using the standard protocol during bolus administration of intravenous contrast. Multiplanar CT image reconstructions and MIPs were obtained to evaluate the vascular anatomy. CONTRAST:  179mL OMNIPAQUE IOHEXOL 350 MG/ML SOLN COMPARISON:  None. FINDINGS: Cardiovascular: There is adequate opacification of the pulmonary arterial tree. There are numerous intraluminal filling defects identified within almost all segmental pulmonary arteries of the lungs bilaterally in keeping with acute pulmonary embolism. The embolic burden is large. The central pulmonary arteries are mildly enlarged in keeping with changes of pulmonary arterial hypertension. There is, however, no CT evidence of right heart strain. Global cardiac size within normal limits. No pericardial effusion. The thoracic aorta is unremarkable.  Mediastinum/Nodes: No enlarged mediastinal, hilar, or axillary lymph nodes. Thyroid gland, trachea, and esophagus demonstrate no significant findings. Lungs/Pleura: Lungs are clear. No pleural effusion or pneumothorax. Upper Abdomen: A a rim calcified subcapsular lesion is partially visualized within the upper pole of the left kidney, incompletely evaluated on this examination but possibly representing a chronic subcapsular hematoma or abscess. No acute abnormality within the visualized upper abdomen. Musculoskeletal: No acute bone abnormality. No lytic or blastic bone lesion. Review of the MIP images confirms the above findings. IMPRESSION: Acute pulmonary embolism. Large embolic burden with enlargement the central pulmonary arteries in keeping with pulmonary arterial hypertension. No CT evidence of right heart strain. Rim calcified subcapsular lesion within the visualized upper pole the left kidney, incompletely imaged on this examination possibly representing a remote subcapsular hematoma or abscess. If indicated, this could be better assessed with dedicated CT or MRI imaging. These results were called by telephone at the time of interpretation on 01/12/2021 at 2:27 am to provider Deyani Hegarty , who verbally acknowledged these results. Electronically Signed   By: Fidela Salisbury MD   On: 01/12/2021 02:27   US Venous Img Lower Unilateral Right  Result Date: 01/11/2021 CLINICAL DATA:  Leg swelling and calf pain. EXAM: RIGHT LOWER EXTREMITY VENOUS DOPPLER ULTRASOUND TECHNIQUE: Gray-scale sonography with compression, as well as color and duplex ultrasound, were performed to evaluate the deep venous system(s) from the level of the common femoral vein through the popliteal and proximal calf veins. COMPARISON:  None. FINDINGS: VENOUS Occlusive thrombus of the popliteal and calf veins with lack of compressibility. Normal compressibility of the common femoral and superficial femoral veins. There is also nonocclusive thrombus  in the saphenous vein from the mid thigh through the mid calf. Included profundus femora is vein is patent. Limited views of the contralateral common femoral vein are unremarkable. OTHER None. Limitations: none IMPRESSION: 1. Positive for DVT with occlusive thrombus involving the popliteal and calf veins. 2. Superficial nonocclusive thrombus in the saphenous vein from the thigh to the calf. These results were called by telephone at the time of interpretation on 01/11/2021 at 11:17 pm to provider [facility] , who verbally acknowledged these results. Electronically Signed   By: Keith Rake M.D.   On: 01/11/2021 23:18    ____________________________________________   PROCEDURES  Procedure(s) performed (including Critical Care):  .1-3 Lead EKG Interpretation Performed by: Paulette Blanch, MD Authorized by: Paulette Blanch, MD     Interpretation: normal     ECG rate:  69   ECG rate assessment: normal     Rhythm: sinus rhythm     Ectopy: none     Conduction: normal   Comments:  Patient placed on cardiac monitor to evaluate for arrhythmias    CRITICAL CARE Performed by: Paulette Blanch   Total critical care time: 45 minutes  Critical care time was exclusive of separately billable procedures and treating other patients.  Critical care was necessary to treat or prevent imminent or life-threatening deterioration.  Critical care was time spent personally by me on the following activities: development of treatment plan with patient and/or surrogate as well as nursing, discussions with consultants, evaluation of patient's response to treatment, examination of patient, obtaining history from patient or surrogate, ordering and performing treatments and interventions, ordering and review of laboratory studies, ordering and review of radiographic studies, pulse oximetry and re-evaluation of patient's condition.  ____________________________________________   INITIAL IMPRESSION / ASSESSMENT  AND PLAN / ED COURSE  As part of my medical decision making, I reviewed the following data within the [place] History obtained from family, Nursing notes reviewed and incorporated, Labs reviewed, EKG interpreted, Old chart reviewed, Radiograph reviewed and Notes from prior ED visits     60 year old male presenting with right lower leg pain and swelling.  Also endorses shortness of breath times several weeks.  Differential diagnosis includes but is not limited to DVT, rhabdomyolysis, infectious, metabolic etiologies, PE, etc.  Ultrasound is positive for DVT.  Given patient's complaint of shortness of breath, will proceed with CTA chest to evaluate for PE.  Clinical Course as of 01/12/21 0231  Wed Jan 12, 2021  0230 Discussed CT results with radiologist.  Updated patient and spouse of CT imaging results.  Will start heparin bolus with drip and discussed with hospitalist services for admission. [JS]    Clinical Course User Index [JS] Paulette Blanch, MD     ____________________________________________   FINAL CLINICAL IMPRESSION(S) / ED DIAGNOSES  Final diagnoses:  Right leg swelling  Acute deep vein thrombosis (DVT) of popliteal vein of right lower extremity ([place])  Acute pulmonary embolism without acute cor pulmonale, unspecified pulmonary embolism type [facility])     ED Discharge Orders    None       Note:  This document was prepared using Dragon voice recognition software and may include unintentional dictation errors.   Paulette Blanch, MD 01/12/21 0300

## 2021-01-12 ENCOUNTER — Inpatient Hospital Stay
Admit: 2021-01-12 | Discharge: 2021-01-12 | Disposition: A | Discharge status: Home / Self Care | Payer: BC Managed Care – PPO | Attending: Family Medicine | Admitting: Family Medicine

## 2021-01-12 ENCOUNTER — Emergency Department: Payer: BC Managed Care – PPO

## 2021-01-12 ENCOUNTER — Encounter
Admission: EM | Disposition: A | Discharge status: Home / Self Care | Payer: Self-pay | Source: Home / Self Care | Attending: Internal Medicine

## 2021-01-12 ENCOUNTER — Encounter: Payer: Self-pay | Admitting: Family Medicine

## 2021-01-12 DIAGNOSIS — N179 Acute kidney failure, unspecified: Secondary | ICD-10-CM

## 2021-01-12 DIAGNOSIS — I82431 Acute embolism and thrombosis of right popliteal vein: Secondary | ICD-10-CM | POA: Diagnosis present

## 2021-01-12 DIAGNOSIS — Z8249 Family history of ischemic heart disease and other diseases of the circulatory system: Secondary | ICD-10-CM | POA: Diagnosis not present

## 2021-01-12 DIAGNOSIS — I5189 Other ill-defined heart diseases: Secondary | ICD-10-CM | POA: Diagnosis not present

## 2021-01-12 DIAGNOSIS — Z820 Family history of epilepsy and other diseases of the nervous system: Secondary | ICD-10-CM | POA: Diagnosis not present

## 2021-01-12 DIAGNOSIS — Z20822 Contact with and (suspected) exposure to covid-19: Secondary | ICD-10-CM | POA: Diagnosis present

## 2021-01-12 DIAGNOSIS — R600 Localized edema: Secondary | ICD-10-CM | POA: Diagnosis not present

## 2021-01-12 DIAGNOSIS — K219 Gastro-esophageal reflux disease without esophagitis: Secondary | ICD-10-CM | POA: Diagnosis present

## 2021-01-12 DIAGNOSIS — I2699 Other pulmonary embolism without acute cor pulmonale: Principal | ICD-10-CM

## 2021-01-12 DIAGNOSIS — R0902 Hypoxemia: Secondary | ICD-10-CM | POA: Diagnosis present

## 2021-01-12 DIAGNOSIS — I4510 Unspecified right bundle-branch block: Secondary | ICD-10-CM | POA: Diagnosis present

## 2021-01-12 DIAGNOSIS — M7989 Other specified soft tissue disorders: Secondary | ICD-10-CM

## 2021-01-12 DIAGNOSIS — I2721 Secondary pulmonary arterial hypertension: Secondary | ICD-10-CM | POA: Diagnosis present

## 2021-01-12 DIAGNOSIS — E669 Obesity, unspecified: Secondary | ICD-10-CM | POA: Diagnosis present

## 2021-01-12 DIAGNOSIS — Z6835 Body mass index (BMI) 35.0-35.9, adult: Secondary | ICD-10-CM | POA: Diagnosis not present

## 2021-01-12 DIAGNOSIS — E785 Hyperlipidemia, unspecified: Secondary | ICD-10-CM | POA: Diagnosis present

## 2021-01-12 DIAGNOSIS — Z833 Family history of diabetes mellitus: Secondary | ICD-10-CM | POA: Diagnosis not present

## 2021-01-12 DIAGNOSIS — N1831 Chronic kidney disease, stage 3a: Secondary | ICD-10-CM | POA: Diagnosis present

## 2021-01-12 DIAGNOSIS — Z823 Family history of stroke: Secondary | ICD-10-CM | POA: Diagnosis not present

## 2021-01-12 DIAGNOSIS — Z79899 Other long term (current) drug therapy: Secondary | ICD-10-CM | POA: Diagnosis not present

## 2021-01-12 DIAGNOSIS — Z86711 Personal history of pulmonary embolism: Secondary | ICD-10-CM | POA: Diagnosis present

## 2021-01-12 DIAGNOSIS — I82811 Embolism and thrombosis of superficial veins of right lower extremities: Secondary | ICD-10-CM | POA: Diagnosis present

## 2021-01-12 HISTORY — PX: PULMONARY THROMBECTOMY: CATH118295

## 2021-01-12 LAB — BASIC METABOLIC PANEL
Anion gap: 7 (ref 5–15)
BUN: 20 mg/dL (ref 6–20)
CO2: 23 mmol/L (ref 22–32)
Calcium: 8.2 mg/dL — ABNORMAL LOW (ref 8.9–10.3)
Chloride: 106 mmol/L (ref 98–111)
Creatinine, Ser: 1.17 mg/dL (ref 0.61–1.24)
GFR, Estimated: >60 mL/min (ref >60)
Glucose, Bld: 90 mg/dL (ref 70–99)
Potassium: 3.8 mmol/L (ref 3.5–5.1)
Sodium: 136 mmol/L (ref 135–145)

## 2021-01-12 LAB — CBC
HCT: 39.3 % (ref 39.0–52.0)
Hemoglobin: 13.4 g/dL (ref 13.0–17.0)
MCH: 31.4 pg (ref 26.0–34.0)
MCHC: 34.1 g/dL (ref 30.0–36.0)
MCV: 92 fL (ref 80.0–100.0)
Platelets: 235 10*3/uL (ref 150–400)
RBC: 4.27 MIL/uL (ref 4.22–5.81)
RDW: 12.4 % (ref 11.5–15.5)
WBC: 6.9 10*3/uL (ref 4.0–10.5)
nRBC: 0 % (ref 0.0–0.2)

## 2021-01-12 LAB — PROTIME-INR
INR: 0.9 (ref 0.8–1.2)
Prothrombin Time: 12.6 seconds (ref 11.4–15.2)

## 2021-01-12 LAB — TROPONIN I (HIGH SENSITIVITY): Troponin I (High Sensitivity): 27 ng/L — ABNORMAL HIGH (ref <18)

## 2021-01-12 LAB — RESP PANEL BY RT-PCR (FLU A&B, COVID) ARPGX2
Influenza A by PCR: NEGATIVE
Influenza B by PCR: NEGATIVE
SARS Coronavirus 2 by RT PCR: NEGATIVE

## 2021-01-12 LAB — APTT: aPTT: 28 seconds (ref 24–36)

## 2021-01-12 LAB — HEPARIN LEVEL (UNFRACTIONATED)
Heparin Unfractionated: 0.31 IU/mL (ref 0.30–0.70)
Heparin Unfractionated: 0.13 IU/mL — ABNORMAL LOW (ref 0.30–0.70)

## 2021-01-12 LAB — HIV ANTIBODY (ROUTINE TESTING W REFLEX): HIV Screen 4th Generation wRfx: NONREACTIVE

## 2021-01-12 SURGERY — PULMONARY THROMBECTOMY
Anesthesia: Moderate Sedation | Laterality: Bilateral

## 2021-01-12 MED ORDER — ACETAMINOPHEN 325 MG PO TABS: 650.0000 mg | ORAL_TABLET | Freq: Four times a day (QID) | ORAL | Status: DC | PRN

## 2021-01-12 MED ORDER — SODIUM CHLORIDE 0.9 % IV SOLN: INTRAVENOUS | Status: DC

## 2021-01-12 MED ORDER — FENTANYL CITRATE (PF) 100 MCG/2ML IJ SOLN
INTRAMUSCULAR | Status: DC | PRN
  Administered 2021-01-12: 25 ug via INTRAVENOUS
  Administered 2021-01-12: 50 ug via INTRAVENOUS

## 2021-01-12 MED ORDER — HEPARIN SODIUM (PORCINE) 1000 UNIT/ML IJ SOLN
INTRAMUSCULAR | Status: AC
  Filled 2021-01-12: qty 1

## 2021-01-12 MED ORDER — ALTEPLASE 2 MG IJ SOLR
INTRAMUSCULAR | Status: AC
  Filled 2021-01-12: qty 8

## 2021-01-12 MED ORDER — HEPARIN (PORCINE) 25000 UT/250ML-% IV SOLN: 14.0000 [IU]/kg/h | INTRAVENOUS | Status: DC

## 2021-01-12 MED ORDER — CEFAZOLIN SODIUM-DEXTROSE 2-4 GM/100ML-% IV SOLN
2.0000 g | INTRAVENOUS | Status: DC
  Filled 2021-01-12: qty 100

## 2021-01-12 MED ORDER — HEPARIN BOLUS VIA INFUSION
4000.0000 [IU] | Freq: Once | INTRAVENOUS | Status: AC
  Administered 2021-01-12: 4000 [IU] via INTRAVENOUS
  Filled 2021-01-12: qty 4000

## 2021-01-12 MED ORDER — MAGNESIUM HYDROXIDE 400 MG/5ML PO SUSP: 30.0000 mL | Freq: Every day | ORAL | Status: DC | PRN

## 2021-01-12 MED ORDER — FENTANYL CITRATE (PF) 100 MCG/2ML IJ SOLN
INTRAMUSCULAR | Status: AC
  Filled 2021-01-12: qty 2

## 2021-01-12 MED ORDER — IODIXANOL 320 MG/ML IV SOLN
INTRAVENOUS | Status: DC | PRN
  Administered 2021-01-12: 70 mL

## 2021-01-12 MED ORDER — TRAZODONE HCL 50 MG PO TABS: 25.0000 mg | ORAL_TABLET | Freq: Every evening | ORAL | Status: DC | PRN

## 2021-01-12 MED ORDER — ALTEPLASE 2 MG IJ SOLR
INTRAMUSCULAR | Status: AC
  Filled 2021-01-12: qty 2

## 2021-01-12 MED ORDER — ACETAMINOPHEN 650 MG RE SUPP: 650.0000 mg | Freq: Four times a day (QID) | RECTAL | Status: DC | PRN

## 2021-01-12 MED ORDER — HEPARIN SODIUM (PORCINE) 5000 UNIT/ML IJ SOLN: 4000.0000 [IU] | Freq: Once | INTRAMUSCULAR | Status: DC

## 2021-01-12 MED ORDER — ONDANSETRON HCL 4 MG/2ML IJ SOLN: 4.0000 mg | Freq: Four times a day (QID) | INTRAMUSCULAR | Status: DC | PRN

## 2021-01-12 MED ORDER — CEFAZOLIN SODIUM-DEXTROSE 2-4 GM/100ML-% IV SOLN
INTRAVENOUS | Status: AC
  Filled 2021-01-12: qty 100

## 2021-01-12 MED ORDER — HEPARIN (PORCINE) 25000 UT/250ML-% IV SOLN
1450.0000 [IU]/h | INTRAVENOUS | Status: DC
  Administered 2021-01-12: 1350 [IU]/h via INTRAVENOUS
  Administered 2021-01-12: 1450 [IU]/h via INTRAVENOUS
  Filled 2021-01-12: qty 250

## 2021-01-12 MED ORDER — IOHEXOL 350 MG/ML SOLN
100.0000 mL | Freq: Once | INTRAVENOUS | Status: AC | PRN
  Administered 2021-01-12: 100 mL via INTRAVENOUS

## 2021-01-12 MED ORDER — MIDAZOLAM HCL 2 MG/2ML IJ SOLN
INTRAMUSCULAR | Status: DC | PRN
  Administered 2021-01-12: 2 mg via INTRAVENOUS
  Administered 2021-01-12: 1 mg via INTRAVENOUS

## 2021-01-12 MED ORDER — ONDANSETRON HCL 4 MG PO TABS
4.0000 mg | ORAL_TABLET | Freq: Four times a day (QID) | ORAL | Status: DC | PRN
Start: 2021-01-12 — End: 2021-01-13

## 2021-01-12 MED ORDER — ALTEPLASE 2 MG IJ SOLR
INTRAMUSCULAR | Status: DC | PRN
  Administered 2021-01-12 (×2): 5 mg

## 2021-01-12 MED ORDER — MIDAZOLAM HCL 5 MG/5ML IJ SOLN
INTRAMUSCULAR | Status: AC
  Filled 2021-01-12: qty 5

## 2021-01-12 MED ORDER — HEPARIN (PORCINE) 25000 UT/250ML-% IV SOLN
1650.0000 [IU]/h | INTRAVENOUS | Status: DC
  Administered 2021-01-12: 1450 [IU]/h via INTRAVENOUS
  Administered 2021-01-13: 1650 [IU]/h via INTRAVENOUS
  Filled 2021-01-12 (×2): qty 250

## 2021-01-12 SURGICAL SUPPLY — 14 items
CANISTER PENUMBRA ENGINE (MISCELLANEOUS) ×2 IMPLANT
CANNULA 5F STIFF (CANNULA) ×2 IMPLANT
CATH ANGIO 5F PIGTAIL 100CM (CATHETERS) ×2 IMPLANT
CATH INDIGO SEP 8 (CATHETERS) ×2 IMPLANT
CATH LIGHTNING 8 XTORQ 115 (CATHETERS) ×2 IMPLANT
COVER PROBE U/S 5X48 (MISCELLANEOUS) ×2 IMPLANT
DEVICE SAFEGUARD 24CM (GAUZE/BANDAGES/DRESSINGS) ×2 IMPLANT
DEVICE TORQUE (MISCELLANEOUS) ×2 IMPLANT
GLIDEWIRE ANGLED SS 035X260CM (WIRE) ×2 IMPLANT
PACK ANGIOGRAPHY (CUSTOM PROCEDURE TRAY) ×3 IMPLANT
SHEATH BRITE TIP 8FRX11 (SHEATH) ×2 IMPLANT
SYR MEDRAD MARK 7 150ML (SYRINGE) ×2 IMPLANT
TUBING CONTRAST HIGH PRESS 48 (TUBING) ×4 IMPLANT
WIRE GUIDERIGHT .035X150 (WIRE) ×2 IMPLANT

## 2021-01-12 NOTE — Progress Notes (Signed)
ANTICOAGULATION CONSULT NOTE - Initial Consult  Pharmacy Consult for Heparin  Indication: chest pain/ACS  No Known Allergies  Patient Measurements: Height: 5\' 10"  (177.8 cm) Weight: 108.9 kg (240 lb) IBW/kg (Calculated) : 73 Heparin Dosing Weight: 96.5 kg   Vital Signs: Temp: 97.9 F (36.6 C) (06/08 0020) Temp Source: Oral (06/08 0020) BP: 146/96 (06/08 0204) Pulse Rate: 64 (06/08 0204)  Labs: Recent Labs    01/11/21 2100 01/12/21 0105  HGB 14.0  --   HCT 40.5  --   PLT 225  --   CREATININE 1.36*  --   TROPONINIHS 29* 27*    Estimated Creatinine Clearance: 73.2 mL/min (A) (by C-G formula based on SCr of 1.36 mg/dL (H)).   Medical History: Past Medical History:  Diagnosis Date  . GERD (gastroesophageal reflux disease)   . Hyperlipemia    statin caused bump in LFTs, saw cards, rec treatment if LDL >130  . 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   No prior anticoag noted.  CrCl = 73.2 ml/min   Goal of Therapy:  Heparin level 0.3-0.7 units/ml Monitor platelets by anticoagulation protocol: Yes   Plan:  Give 4000 units bolus x 1 Start heparin infusion at 1350 units/hr Check anti-Xa level in 6 hours and daily while on heparin Continue to monitor H&H and platelets  Samiksha Pellicano D 01/12/2021,3:08 AM

## 2021-01-12 NOTE — Consult Note (Signed)
[facility] Vascular Consult Note  MRN : [id number]  Alejandro Decker is a 59 y.o. (06/27/1961) male who presents with chief complaint of  Chief Complaint  Patient presents with  . Leg Swelling  .  History of Present Illness:  I am asked to evaluate the patient by Dr Cruzita Lederer.  He is a 60 year old gentleman who presented to the emergency room here at [facility] yesterday with the chief complaint of leg swelling.  He states the right calf area became acutely swollen late last week but that he noted swelling and pain going on for several weeks.  Although he denies any cough or hemoptysis at this time and he does not have any complaints of chest pain or palpitations he does note that he frequently goes to the gym and over the past month or so has barely been able to exercise.  He is gone from going 3 miles on the treadmill to going about a quarter of a mile.  No past history of DVT or PE.  Patient does not have a history of sedentary activity or prolonged flights or car rides.  No recent traumas.  Current Facility-Administered Medications  Medication Dose Route Frequency Provider Last Rate Last Admin  . 0.9 %  sodium chloride infusion   Intravenous Continuous Mansy, Jan A, MD 100 mL/hr at 01/12/21 0536 Infusion Verify at 01/12/21 0536  . acetaminophen (TYLENOL) tablet 650 mg  650 mg Oral Q6H PRN Mansy, Jan A, MD       Or  . acetaminophen (TYLENOL) suppository 650 mg  650 mg Rectal Q6H PRN Mansy, Jan A, MD      . Derrill Memo ON 01/13/2021] ceFAZolin (ANCEF) IVPB 2g/100 mL premix  2 g Intravenous On Call to [place], Dolores Lory, MD      . heparin ADULT infusion 100 units/mL (25000 units/259mL)  1,450 Units/hr Intravenous Continuous Lorna Dibble, RPH 14.5 mL/hr at 01/12/21 1042 1,450 Units/hr at 01/12/21 1042  . magnesium hydroxide (MILK OF MAGNESIA) suspension 30 mL  30 mL Oral Daily PRN Mansy, Jan A, MD      . ondansetron [facility]) tablet 4 mg  4 mg Oral  Q6H PRN Mansy, Jan A, MD       Or  . ondansetron [facility]) injection 4 mg  4 mg Intravenous Q6H PRN Mansy, Jan A, MD      . traZODone (DESYREL) tablet 25 mg  25 mg Oral QHS PRN Mansy, Arvella Merles, MD        Past Medical History:  Diagnosis Date  . GERD (gastroesophageal reflux disease)   . Hyperlipemia    statin caused bump in LFTs, saw cards, rec treatment if LDL >130  . 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 CARDIOVASCULAR STRESS TEST  05/2015   ETT - WNL, EF 58% (Dr Einar Gip)  . CT CTA CORONARY W/CA SCORE W/CM &/OR WO/CM  05/2015   score of 6  . LASIK  11/17/2009   Dr. Gaspar Bidding, [place], [facility]  . TONSILLECTOMY  7 yoa  . US ECHOCARDIOGRAPHY  05/2015   mild concentric LVH, EF 66%, mildly dilated aortic root 4.1cm    Social History Social History   Tobacco Use  . Smoking status: Never Smoker  . Smokeless tobacco: Never Used  Substance Use Topics  . Alcohol use: Yes    Alcohol/week: 6.0 standard drinks    Types: 6 drink(s) per week  Comment: weekly  . Drug use: No    Family History Family History  Problem Relation Age of Onset  . Benign prostatic hyperplasia Father   . Diabetes Maternal Grandfather   . Heart failure Maternal Grandfather   . Parkinsonism Paternal Grandmother   . Stroke Paternal Grandfather   . Coronary artery disease Neg Hx   . Cancer Neg Hx   No family history of bleeding/clotting disorders, porphyria or autoimmune disease   No Known Allergies   REVIEW OF SYSTEMS (Negative unless checked)  Constitutional: [] Weight loss  [] Fever  [] Chills Cardiac: [] Chest pain   [] Chest pressure   [] Palpitations   [] Shortness of breath when laying flat   [] Shortness of breath at rest   [x] Shortness of breath with exertion. Vascular:  [x] Pain in legs with walking   [] Pain in legs at rest   [] Pain in legs when laying flat   [] Claudication   [] Pain in feet when walking  [] Pain in feet at rest  [] Pain in feet when laying flat    [] History of DVT   [] Phlebitis   [x] Swelling in legs   [] Varicose veins   [] Non-healing ulcers Pulmonary:   [] Uses home oxygen   [] Productive cough   [] Hemoptysis   [] Wheeze  [] COPD   [] Asthma Neurologic:  [] Dizziness  [] Blackouts   [] Seizures   [] History of stroke   [] History of TIA  [] Aphasia   [] Temporary blindness   [] Dysphagia   [] Weakness or numbness in arms   [] Weakness or numbness in legs Musculoskeletal:  [] Arthritis   [] Joint swelling   [] Joint pain   [] Low back pain Hematologic:  [] Easy bruising  [] Easy bleeding   [] Hypercoagulable state   [] Anemic  [] Hepatitis Gastrointestinal:  [] Blood in stool   [] Vomiting blood  [x] Gastroesophageal reflux/heartburn   [] Difficulty swallowing. Genitourinary:  [] Chronic kidney disease   [] Difficult urination  [] Frequent urination  [] Burning with urination   [] Blood in urine Skin:  [] Rashes   [] Ulcers   [] Wounds Psychological:  [] History of anxiety   []  History of major depression.    Physical Examination  Vitals:   01/12/21 0416 01/12/21 0425 01/12/21 0817 01/12/21 1238  BP: (!) 156/99  139/84 (!) 156/94  Pulse: 64  64 60  Resp: 18  19 18   Temp: 98.1 F (36.7 C)  97.9 F (36.6 C) 97.9 F (36.6 C)  TempSrc:      SpO2: 94%  96% 95%  Weight: 112.9 kg 112.9 kg    Height:  5\' 10"  (1.778 m)     Body mass index is 35.71 kg/m.  Head: [place]/AT, No temporalis wasting. Prominent temp pulse not noted. Ear/Nose/Throat: Nares w/o erythema or drainage, oropharynx w/o obsrtuction, Mallampati score: 3.  Dentition good.  Eyes: PERRLA, Sclera nonicteric.  Neck: Supple, no nuchal rigidity.  No bruit or JVD.  Pulmonary:  Breath sounds equal bilaterally, no use of accessory muscles.  Cardiac: RRR, normal S1, S2, no Murmurs, rubs or gallops. Vascular: Mild edema of the right calf Gastrointestinal: soft, non-tender, non-distended.  Musculoskeletal: Moves all extremities.  No deformity or atrophy. No edema. Neurologic: CN 2-12 intact. Symmetrical.  Speech is  fluent.  Psychiatric: Judgment intact, Mood & affect appropriate for pt's clinical situation. Dermatologic: No rashes or ulcers noted.  No cellulitis or open wounds.      CBC Lab Results  Component Value Date   WBC 6.9 01/12/2021   HGB 13.4 01/12/2021   HCT 39.3 01/12/2021   MCV 92.0 01/12/2021   PLT 235 01/12/2021  BMET    Component Value Date/Time   NA 136 01/12/2021 0251   K 3.8 01/12/2021 0251   CL 106 01/12/2021 0251   CO2 23 01/12/2021 0251   GLUCOSE 90 01/12/2021 0251   BUN 20 01/12/2021 0251   CREATININE 1.17 01/12/2021 0251   CALCIUM 8.2 (L) 01/12/2021 0251   GFRNONAA >60 01/12/2021 0251   GFRAA 70 12/12/2007 0921   Estimated Creatinine Clearance: 86.6 mL/min (by C-G formula based on SCr of 1.17 mg/dL).  COAG Lab Results  Component Value Date   INR 0.9 01/12/2021    Radiology I have personally reviewed the CT angio of the chest dated 01/12/2021.  This shows thrombus in virtually all of the lobar pulmonary arteries extending into subsegmental branches.  All 5 lobes appear to be significantly affected.  There is also significant pulmonary hypertension identified on the CT scan although right heart strain is not identified.  I have personally reviewed the DVT scan from 01/11/2021.  There appears to be noncompressible popliteal findings but no significant proximal findings.    Assessment/Plan 1.  Pulmonary emboli involving virtually all the segmental branches: Given the findings of pulmonary hypertension as well as his exercise intolerance I have discussed pulmonary thrombectomy with the patient.  I have described the penumbra catheter.  I have reviewed options including conservative therapy with anticoagulation alone.  Based on the information the patient wishes to proceed with pulmonary thrombectomy.  The risks and benefits as well as alternative therapies were reviewed all questions have been answered patient agrees to proceed.  He will be maintained on heparin  in the interim and will be started on Eliquis tomorrow after the procedure.  2.  DVT right lower extremity: The duplex ultrasound demonstrates popliteal findings however there are no proximal SFV or common femoral vein findings.  Given this I do not recommend thrombectomy at this time and would continue with anticoagulation.  3.  Hyperlipidemia: Continue statin as ordered and reviewed, no changes at this time.  4.  Gastroesophageal reflux: Continue PPI as already ordered, this medication has been reviewed and there are no changes at this time.  Avoidence of caffeine and alcohol  Moderate elevation of the head of the bed     Hortencia Pilar, MD  01/12/2021 1:23 PM

## 2021-01-12 NOTE — Progress Notes (Signed)
*  PRELIMINARY RESULTS* Echocardiogram 2D Echocardiogram has been performed.  Alejandro Decker 01/12/2021, 8:39 AM

## 2021-01-12 NOTE — Op Note (Signed)
[facility]  Percutaneous Study/Intervention Procedural Note   Date of Surgery: 01/12/2021,4:56 PM  Surgeon:Kailany Dinunzio, Dolores Lory   Pre-operative Diagnosis: Pulmonary emboli with right heart strain and hypoxia  Post-operative diagnosis:  Same  Procedure(s) Performed:  1.  Selective injection right segmental pulmonary arteries  2.  Selective injection left segmental pulmonary arteries  3.  Infusion of TPA for thrombolysis  4.  Mechanical thrombectomy pulmonary emboli using penumbra CAT 8 device with lightening technology.    Anesthesia: Conscious sedation was administered under my direct supervision by the interventional radiology RN. IV Versed plus fentanyl were utilized. Continuous ECG, pulse oximetry and blood pressure was monitored throughout the entire procedure.  Conscious sedation was administered for a total of 37 minutes and 17 seconds.  Sheath: 8 French 11 cm Pinnacle sheath right common femoral vein  Contrast: 70 cc   Fluoroscopy Time: 6.8 minutes  Indications:  Patient presented to the hospital with hypoxia and shortness of breath. The patient is unable to get out of bed and transfer to a chair without increased dyspnea. The patient's O2 saturations off nasal cannula are in the 80%. CT angiogram demonstrated bilateral pulmonary emboli associated with significant right heart strain. Given the long-term sequela and the patient's symptomatic condition the risks and benefits for angiography with thrombectomy are reviewed all questions are answered, the patient agrees to proceed.  Procedure:  Glenden Rossell Ricciis a 60 y.o. male who was identified and appropriate procedural time out was performed.  The patient was then placed supine on the table and prepped and draped in the usual sterile fashion.  Ultrasound was used to evaluate the right common femoral vein.  It was compressible indicating it is patent .  A digital ultrasound image was acquired for the permanent  record.  A micropuncture needle was used to access the right common femoral vein under direct ultrasound guidance.  A microwire was then advanced under fluoroscopic guidance followed by micro-sheath.  A 0.035 J wire was advanced without resistance and a 8 Fr sheath was placed.    The J-wire and pigtail catheter was advanced up to the right atrium where a bolus injection contrast was used to demonstrate the pulmonary artery outflow.  Stiff angled Glidewire was then exchanged for the J-wire and the pigtail catheter was exchanged for an JR4. Using the combination of the stiff angle Glidewire and the JR4 catheter the pulmonary outflow track was negotiated.  With the catheter in the left side bolus injection contrast was utilized to demonstrate the left pulmonary artery vasculature. This demonstrated thrombus within the lobar branches extending into the segmental branches in the upper and lower lobes.  The catheter was then advanced out so that it was positioned within the thrombus and 5 milligrams of TPA were infused directly into the clot on the left side.  While the TPA was dwelling for approximately 30 minutes the catheter was then advanced into the opposite side and the catheter was used to image the right pulmonary vasculature.  This showed thrombus within the right upper middle and lower lobar arteries that extended into the segmental branches  After an appropriate dwell time the stiff angle Glidewire was reintroduced through the catheter.  The Penumbra Cat 8 extra torque catheter was then advanced into the thrombus in the left.  Wire was then exchanged for a separator.  The penumbra CAT 8 device in combination with the separator was then used to treat the left upper lobe to the segmental level and then  the left lower lobe to the segmental level multiple passes were made in both lobes varying the position to achieve a more complete thrombectomy.  After multiple passes the catheter was then repositioned  into the right.  Again, using the separator and the Hi-Torque catheter I first addressed the lower lobe negotiated into the segmental branches and performing mechanical thrombectomy with the separator.  I then maneuvered the CAT 8 into the middle lobe where multiple passes were made and lastly the upper lobe was treated.  It should be noted in both the right and left lungs each time I negotiated into a lobar artery angiography was performed.  After multiple passes there was free flow of blood and therefore the catheter was repositioned into the main pulmonary artery first on the left than on the right and bolus injection contrast was used to perform follow-up angiography.   After review these images the catheter and sheath were removed and pressure held. There were no immediate complications.  Findings:   Right pulmonary artery: Initial images demonstrated the right main pulmonary artery was free of thrombus however thrombus was identified in the right upper middle and lower lobe pulmonary arteries extending into the segmental branches.  Following treatment of each of the lobar pulmonary arteries including the segmental portions there is now minimal thrombus identified, I estimate 85 to 90% of the previously identified thrombus is been removed.  Left pulmonary: The left main pulmonary artery is free of thrombus.  However in the left upper and left lower lobe pulmonary arteries thrombus is identified which extends into the segmental branches.  In similar fashion to the description above the follow-up imaging of the left after treatment of both the upper lobe and the lower lobe demonstrates minimal residual thrombus.  I estimate 85 to 90% of the previously identified thrombus has been removed.    Disposition: Patient was taken to the recovery room in stable condition having tolerated the procedure well.  Belenda Cruise Zackeriah Kissler 01/12/2021,4:56 PM

## 2021-01-12 NOTE — Progress Notes (Addendum)
ANTICOAGULATION CONSULT NOTE - Initial Consult  Pharmacy Consult for Heparin  Indication: pulmonary embolus and DVT  No Known Allergies  Patient Measurements: Height: 5\' 10"  (177.8 cm) Weight: 112.9 kg (248 lb 14.4 oz) IBW/kg (Calculated) : 73 Heparin Dosing Weight: 96.5 kg   Vital Signs: Temp: 97.9 F (36.6 C) (06/08 0817) Temp Source: Oral (06/08 0020) BP: 139/84 (06/08 0817) Pulse Rate: 64 (06/08 0817)  Labs: Recent Labs    01/11/21 2100 01/12/21 0105 01/12/21 0251 01/12/21 0912  HGB 14.0  --  13.4  --   HCT 40.5  --  39.3  --   PLT 225  --  235  --   APTT  --   --  28  --   LABPROT  --   --  12.6  --   INR  --   --  0.9  --   HEPARINUNFRC  --   --   --  0.31  CREATININE 1.36*  --  1.17  --   TROPONINIHS 29* 27*  --   --     Estimated Creatinine Clearance: 86.6 mL/min (by C-G formula based on SCr of 1.17 mg/dL).   Medical History: Past Medical History:  Diagnosis Date  . GERD (gastroesophageal reflux disease)   . Hyperlipemia    statin caused bump in LFTs, saw cards, rec treatment if LDL >130  . 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 cetirizine (ZYRTEC) 10 MG tablet Take 10 mg by mouth daily.   Past Week at Unknown time  . diphenhydrAMINE (BENADRYL) 25 mg capsule Take 50 mg by mouth at bedtime as needed.       Assessment: Pharmacy consulted to dose heparin in this 60 year old male admitted with acute PE with large clot burden & pulmHTN 2/2 RLE DVT.  No prior anticoag noted.   CrCl = 73.2 ml/min  Heparin Dosing Weight: 96.5 kg   Date Time aPTT/HL Rate/Comment 6/08 0912 0.31  1350 un/hr       Baseline Labs: aPTT - 28s INR - 0.9 Hgb - 14>13.4 Plts - 225>235 Trop: 29>27   Goal of Therapy:  Heparin level 0.3-0.7 units/ml Monitor platelets by anticoagulation protocol: Yes   Plan:  Anti-Xa is at lower limit of therapeutic range. Will increase heparin rate slightly  from 1350 > 1450 un/hr. Check anti-Xa level in 6 hours and daily while on heparin Continue to monitor H&H and platelets  Shanon Brow Shemekia Patane 01/12/2021,9:54 AM

## 2021-01-12 NOTE — H&P (Addendum)
[place]   PATIENT NAME: Alejandro Decker    MR#:  [id number]  DATE OF BIRTH:  04-02-1961  DATE OF ADMISSION:  01/11/2021  PRIMARY CARE PHYSICIAN: Alejandro Bush, MD   Patient is coming from: Home  REQUESTING/REFERRING PHYSICIAN: Lurline Hare, MD  CHIEF COMPLAINT:   Chief Complaint  Patient presents with  . Leg Swelling    HISTORY OF PRESENT ILLNESS:  Alejandro Decker is a 60 y.o. male with medical history significant for GERD and dyslipidemia, who presented to the emergency room with acute onset of right lower extremity swelling and pain that started on Friday night as well as dyspnea which has been going for a couple weeks, mainly on exertion.  He denies any cough or hemoptysis.  He admitted to occasional wheezing.  No fever or chills.  No chest pain or palpitations.  No bleeding diathesis.  No nausea or vomiting or abdominal pain.  He denies any headache or dizziness or blurred vision.  No paresthesias or focal muscle weakness.    ED Course: Upon presentation to the emergency room, vital signs were within normal.  Labs revealed creatinine of 1.36 with BUN of 20.  High-sensitivity troponin I was 29 and later 27.  CBC was within normal.  EKG as reviewed by me : Showed normal sinus rhythm with rate of 75 with sinus arrhythmia and T wave inversion inferolaterally.  Imaging: Two-view chest x-ray showed no acute cardiopulmonary disease.  Venous duplex showed DVT with occlusive thrombus involving the left popliteal and calf veins with superficial nonocclusive thrombus in the saphenous vein from the thigh to the calf. Chest CTA revealed: Acute pulmonary embolism. Large embolic burden with enlargement the central pulmonary arteries in keeping with pulmonary arterial hypertension. No CT evidence of right heart strain.  Rim calcified subcapsular lesion within the visualized upper pole the left kidney, incompletely imaged on this examination possibly representing a remote subcapsular  hematoma or abscess. If indicated, this could be better assessed with dedicated CT or MRI imaging  The patient was given 1 L bolus of IV normal saline and was started on IV heparin with bolus and drip.  He will be admitted to a progressive unit bed for further evaluation and management. PAST MEDICAL HISTORY:   Past Medical History:  Diagnosis Date  . GERD (gastroesophageal reflux disease)   . Hyperlipemia    statin caused bump in LFTs, saw cards, rec treatment if LDL >130  . 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 CARDIOVASCULAR STRESS TEST  05/2015   ETT - WNL, EF 58% (Dr Alejandro Decker)  . CT CTA CORONARY W/CA SCORE W/CM &/OR WO/CM  05/2015   score of 6  . LASIK  11/17/2009   Dr. Gaspar Decker, [place], [facility]  . TONSILLECTOMY  7 yoa  . US ECHOCARDIOGRAPHY  05/2015   mild concentric LVH, EF 66%, mildly dilated aortic root 4.1cm    SOCIAL HISTORY:   Social History   Tobacco Use  . Smoking status: Never Smoker  . Smokeless tobacco: Never Used  Substance Use Topics  . Alcohol use: Yes    Alcohol/week: 6.0 standard drinks    Types: 6 drink(s) per week    Comment: weekly    FAMILY HISTORY:   Family History  Problem Relation Age of Onset  . Benign prostatic hyperplasia Father   . Diabetes Maternal Grandfather   . Heart failure Maternal Grandfather   .  Parkinsonism Paternal Grandmother   . Stroke Paternal Grandfather   . Coronary artery disease Neg Hx   . Cancer Neg Hx     DRUG ALLERGIES:  No Known Allergies  REVIEW OF SYSTEMS:   ROS As per history of present illness. All pertinent systems were reviewed above. Constitutional, HEENT, cardiovascular, respiratory, GI, GU, musculoskeletal, neuro, psychiatric, endocrine, integumentary and hematologic systems were reviewed and are otherwise negative/unremarkable except for positive findings mentioned above in the HPI.   MEDICATIONS AT HOME:   Prior to  Admission medications   Medication Sig Start Date End Date Taking? Authorizing Provider  cetirizine (ZYRTEC) 10 MG tablet Take 10 mg by mouth daily.    [provider]  diphenhydrAMINE (BENADRYL) 25 mg capsule Take 50 mg by mouth at bedtime as needed.    [provider]      VITAL SIGNS:  Blood pressure (!) 146/96, pulse 64, temperature 97.9 F (36.6 C), temperature source Oral, resp. rate 17, height 5\' 10"  (1.778 m), weight 108.9 kg, SpO2 96 %.  PHYSICAL EXAMINATION:  Physical Exam  GENERAL:  60 y.o.-year-old Caucasian male patient lying in the bed with no acute distress.  EYES: Pupils equal, round, reactive to light and accommodation. No scleral icterus. Extraocular muscles intact.  HEENT: Head atraumatic, normocephalic. Oropharynx and nasopharynx clear.  NECK:  Supple, no jugular venous distention. No thyroid enlargement, no tenderness.  LUNGS: Normal breath sounds bilaterally, no wheezing, rales,rhonchi or crepitation. No use of accessory muscles of respiration.  CARDIOVASCULAR: Regular rate and rhythm, S1, S2 normal. No murmurs, rubs, or gallops.  ABDOMEN: Soft, nondistended, nontender. Bowel sounds present. No organomegaly or mass.  EXTREMITIES: Right leg and calf tense swelling with negative Homans sign.  No left leg swelling.  No cyanosis or clubbing. NEUROLOGIC: Cranial nerves II through XII are intact. Muscle strength 5/5 in all extremities. Sensation intact. Gait not checked.  PSYCHIATRIC: The patient is alert and oriented x 3.  Normal affect and good eye contact. SKIN: No obvious rash, lesion, or ulcer.   LABORATORY PANEL:   CBC Recent Labs  Lab 01/11/21 2100  WBC 8.5  HGB 14.0  HCT 40.5  PLT 225   ------------------------------------------------------------------------------------------------------------------  Chemistries  Recent Labs  Lab 01/11/21 2100  NA 135  K 4.0  CL 104  CO2 21*  GLUCOSE 94  BUN 20  CREATININE 1.36*  CALCIUM 8.5*    ------------------------------------------------------------------------------------------------------------------  Cardiac Enzymes No results for input(s): TROPONINI in the last 168 hours. ------------------------------------------------------------------------------------------------------------------  RADIOLOGY:  DG Chest 2 View  Result Date: 01/11/2021 CLINICAL DATA:  Pain and swelling of right lower leg and calf. Shortness of breath. EXAM: CHEST - 2 VIEW COMPARISON:  06/08/2014 FINDINGS: The heart size appears normal no pleural effusion or edema. No airspace opacities. Spondylosis identified throughout the mid and lower thoracic spine. IMPRESSION: No active cardiopulmonary abnormalities. Electronically Signed   By: Kerby Moors M.D.   On: 01/11/2021 21:30   CT Angio Chest PE W/Cm &/Or Wo Cm  Result Date: 01/12/2021 CLINICAL DATA:  Dyspnea EXAM: CT ANGIOGRAPHY CHEST WITH CONTRAST TECHNIQUE: Multidetector CT imaging of the chest was performed using the standard protocol during bolus administration of intravenous contrast. Multiplanar CT image reconstructions and MIPs were obtained to evaluate the vascular anatomy. CONTRAST:  181mL OMNIPAQUE IOHEXOL 350 MG/ML SOLN COMPARISON:  None. FINDINGS: Cardiovascular: There is adequate opacification of the pulmonary arterial tree. There are numerous intraluminal filling defects identified within almost all segmental pulmonary arteries of the lungs bilaterally in keeping  with acute pulmonary embolism. The embolic burden is large. The central pulmonary arteries are mildly enlarged in keeping with changes of pulmonary arterial hypertension. There is, however, no CT evidence of right heart strain. Global cardiac size within normal limits. No pericardial effusion. The thoracic aorta is unremarkable. Mediastinum/Nodes: No enlarged mediastinal, hilar, or axillary lymph nodes. Thyroid gland, trachea, and esophagus demonstrate no significant findings. Lungs/Pleura:  Lungs are clear. No pleural effusion or pneumothorax. Upper Abdomen: A a rim calcified subcapsular lesion is partially visualized within the upper pole of the left kidney, incompletely evaluated on this examination but possibly representing a chronic subcapsular hematoma or abscess. No acute abnormality within the visualized upper abdomen. Musculoskeletal: No acute bone abnormality. No lytic or blastic bone lesion. Review of the MIP images confirms the above findings. IMPRESSION: Acute pulmonary embolism. Large embolic burden with enlargement the central pulmonary arteries in keeping with pulmonary arterial hypertension. No CT evidence of right heart strain. Rim calcified subcapsular lesion within the visualized upper pole the left kidney, incompletely imaged on this examination possibly representing a remote subcapsular hematoma or abscess. If indicated, this could be better assessed with dedicated CT or MRI imaging. These results were called by telephone at the time of interpretation on 01/12/2021 at 2:27 am to provider JADE SUNG , who verbally acknowledged these results. Electronically Signed   By: Fidela Salisbury MD   On: 01/12/2021 02:27   US Venous Img Lower Unilateral Right  Result Date: 01/11/2021 CLINICAL DATA:  Leg swelling and calf pain. EXAM: RIGHT LOWER EXTREMITY VENOUS DOPPLER ULTRASOUND TECHNIQUE: Gray-scale sonography with compression, as well as color and duplex ultrasound, were performed to evaluate the deep venous system(s) from the level of the common femoral vein through the popliteal and proximal calf veins. COMPARISON:  None. FINDINGS: VENOUS Occlusive thrombus of the popliteal and calf veins with lack of compressibility. Normal compressibility of the common femoral and superficial femoral veins. There is also nonocclusive thrombus in the saphenous vein from the mid thigh through the mid calf. Included profundus femora is vein is patent. Limited views of the contralateral common femoral vein  are unremarkable. OTHER None. Limitations: none IMPRESSION: 1. Positive for DVT with occlusive thrombus involving the popliteal and calf veins. 2. Superficial nonocclusive thrombus in the saphenous vein from the thigh to the calf. These results were called by telephone at the time of interpretation on 01/11/2021 at 11:17 pm to provider [facility] , who verbally acknowledged these results. Electronically Signed   By: Keith Rake M.D.   On: 01/11/2021 23:18      IMPRESSION AND PLAN:  Active Problems:   Acute pulmonary embolism ([place])  1.  Acute pulmonary embolism with large clot burden and pulmonary hypertension, secondary to right lower extremity DVT. - The patient will be admitted to a progressive unit bed. - We will continue him on IV heparin per protocol. - We will obtain a 2D echo to assess for right ventricular strain. - Pain management will be provided. - This seems to be an unprovoked event that will need further investigations for hypercoagulability after this acute phase likely on outpatient basis. - May need vascular surgery consult especially if he does have right ventricular strain on echo.  2.  Acute kidney injury, likely hypovolemic. - The patient will be hydrated with IV normal saline and will follow her BMP.  DVT prophylaxis: IV heparin. Code Status: full code.   Family Communication:  The plan of care was discussed in details with the patient (  and family). I answered all questions. The patient agreed to proceed with the above mentioned plan. Further management will depend upon hospital course. Disposition Plan: Back to previous home environment Consults called: none. All the records are reviewed and case discussed with ED provider.  Status is: Inpatient  Remains inpatient appropriate because:Ongoing diagnostic testing needed not appropriate for outpatient work up, Unsafe d/c plan, IV treatments appropriate due to intensity of illness or inability to take PO and  Inpatient level of care appropriate due to severity of illness   Dispo: The patient is from: Home              Anticipated d/c is to: Home              Patient currently is not medically stable to d/c.   Difficult to place patient No   TOTAL TIME TAKING CARE OF THIS PATIENT: 55 minutes.    Christel Mormon M.D on 01/12/2021 at 2:56 AM  [facility]   From 7 PM-7 AM, contact night-coverage [email or web address]  CC: Primary care physician; Alejandro Bush, MD

## 2021-01-12 NOTE — Progress Notes (Signed)
CSW provided 30 day Eliquis coupon at bedside per MD request.   No other needs identified at this time.   [place], [place]

## 2021-01-12 NOTE — Progress Notes (Signed)
PROGRESS NOTE  Alejandro Decker ZOX:[id number] DOB: [birth year] DOA: 01/11/2021 PCP: Ria Bush, MD   LOS: 0 days   Brief Narrative / Interim history: 60 year old male with GERD, hyperlipidemia came into the hospital with right lower extremity swelling and shortness of breath.  He was diagnosed with a DVT/PE in the ED, placed on heparin and admitted to the hospital.  Subjective / 24h Interval events: Feeling well this morning, denies any chest pain, denies any shortness of breath at rest but has not been up.  His right leg is still swollen but feels improved  Assessment & Plan: Principal Problem Acute PE/DVT-CT showing large clot burden and pulmonary hypertension.  There is no right heart strain based on the CT but 2D echo is pending.  Consulted vascular surgery, appreciate input.  Currently continue heparin, will be placed on Eliquis when closer to discharge  Active Problems CKD 3 A -creatinine 1.4-1.5 back in 2020, currently at baseline  Obesity-BMI 35, he would benefit from weight loss  Scheduled Meds: Continuous Infusions: . sodium chloride 100 mL/hr at 01/12/21 0536  . heparin 1,450 Units/hr (01/12/21 1042)   PRN Meds:.acetaminophen **OR** acetaminophen, magnesium hydroxide, ondansetron **OR** ondansetron (ZOFRAN) IV, traZODone  Diet Orders (From admission, onward)    Start     Ordered   01/12/21 1037  Diet NPO time specified Except for: [employer], Sips with Meds  Diet effective now       Question Answer Comment  Except for Ice Chips   Except for Sips with Meds      01/12/21 1038          DVT prophylaxis:      Code Status: Full Code  Family Communication: No family at bedside  Status is: Inpatient  Remains inpatient appropriate because:Inpatient level of care appropriate due to severity of illness   Dispo: The patient is from: Home              Anticipated d/c is to: Home              Patient currently is not medically stable to d/c.   Difficult to  place patient No   Level of care: Progressive Cardiac  Consultants:  Vascular surgery   Procedures:  2D echo: pending  Microbiology  none  Antimicrobials: none    Objective: Vitals:   01/12/21 0230 01/12/21 0416 01/12/21 0425 01/12/21 0817  BP: (!) 142/92 (!) 156/99  139/84  Pulse: 68 64  64  Resp: 19 18  19   Temp: 98 F (36.7 C) 98.1 F (36.7 C)  97.9 F (36.6 C)  TempSrc:      SpO2: 96% 94%  96%  Weight:  112.9 kg 112.9 kg   Height:   5\' 10"  (1.778 m)     Intake/Output Summary (Last 24 hours) at 01/12/2021 1130 Last data filed at 01/12/2021 0536 Gross per 24 hour  Intake 98.94 ml  Output --  Net 98.94 ml   Filed Weights   01/11/21 2057 01/12/21 0416 01/12/21 0425  Weight: 108.9 kg 112.9 kg 112.9 kg    Examination:  Constitutional: NAD Eyes: no scleral icterus ENMT: Mucous membranes are moist.  Neck: normal, supple Respiratory: clear to auscultation bilaterally, no wheezing, no crackles. Normal respiratory effort. No accessory muscle use.  Cardiovascular: Regular rate and rhythm, no murmurs / rubs / gallops. No LE edema. Good peripheral pulses Abdomen: non distended, no tenderness. Bowel sounds positive.  Musculoskeletal: no clubbing / cyanosis.  Skin: no rashes Neurologic: CN  2-12 grossly intact. Strength 5/5 in all 4.   Data Reviewed: I have independently reviewed following labs and imaging studies   CBC: Recent Labs  Lab 01/11/21 2100 01/12/21 0251  WBC 8.5 6.9  HGB 14.0 13.4  HCT 40.5 39.3  MCV 91.2 92.0  PLT 225 466   Basic Metabolic Panel: Recent Labs  Lab 01/11/21 2100 01/12/21 0251  NA 135 136  K 4.0 3.8  CL 104 106  CO2 21* 23  GLUCOSE 94 90  BUN 20 20  CREATININE 1.36* 1.17  CALCIUM 8.5* 8.2*   Liver Function Tests: No results for input(s): AST, ALT, ALKPHOS, BILITOT, PROT, ALBUMIN in the last 168 hours. Coagulation Profile: Recent Labs  Lab 01/12/21 0251  INR 0.9   HbA1C: No results for input(s): HGBA1C in the last  72 hours. CBG: No results for input(s): GLUCAP in the last 168 hours.  Recent Results (from the past 240 hour(s))  Resp Panel by RT-PCR (Flu A&B, Covid) Nasopharyngeal Swab     Status: None   Collection Time: 01/12/21 12:23 AM   Specimen: Nasopharyngeal Swab; Nasopharyngeal(NP) swabs in vial transport medium  Result Value Ref Range Status   SARS Coronavirus 2 by RT PCR NEGATIVE NEGATIVE Final    Comment: (NOTE) SARS-CoV-2 target nucleic acids are NOT DETECTED.  The SARS-CoV-2 RNA is generally detectable in upper respiratory specimens during the acute phase of infection. The lowest concentration of SARS-CoV-2 viral copies this assay can detect is 138 copies/mL. A negative result does not preclude SARS-Cov-2 infection and should not be used as the sole basis for treatment or other patient management decisions. A negative result may occur with  improper specimen collection/handling, submission of specimen other than nasopharyngeal swab, presence of viral mutation(s) within the areas targeted by this assay, and inadequate number of viral copies(<138 copies/mL). A negative result must be combined with clinical observations, patient history, and epidemiological information. The expected result is Negative.  Fact Sheet for Patients:  EntrepreneurPulse.com.au  Fact Sheet for Healthcare Providers:  IncredibleEmployment.be  This test is no t yet approved or cleared by the [place] FDA and  has been authorized for detection and/or diagnosis of SARS-CoV-2 by FDA under an Emergency Use Authorization (EUA). This EUA will remain  in effect (meaning this test can be used) for the duration of the COVID-19 declaration under Section 564(b)(1) of the Act, 21 U.S.C.section 360bbb-3(b)(1), unless the authorization is terminated  or revoked sooner.       Influenza A by PCR NEGATIVE NEGATIVE Final   Influenza B by PCR NEGATIVE NEGATIVE Final    Comment:  (NOTE) The Xpert Xpress SARS-CoV-2/FLU/RSV plus assay is intended as an aid in the diagnosis of influenza from Nasopharyngeal swab specimens and should not be used as a sole basis for treatment. Nasal washings and aspirates are unacceptable for Xpert Xpress SARS-CoV-2/FLU/RSV testing.  Fact Sheet for Patients: EntrepreneurPulse.com.au  Fact Sheet for Healthcare Providers: IncredibleEmployment.be  This test is not yet approved or cleared by the [place] FDA and has been authorized for detection and/or diagnosis of SARS-CoV-2 by FDA under an Emergency Use Authorization (EUA). This EUA will remain in effect (meaning this test can be used) for the duration of the COVID-19 declaration under Section 564(b)(1) of the Act, 21 U.S.C. section 360bbb-3(b)(1), unless the authorization is terminated or revoked.  Performed at [facility], [address], [place], [place] [address]      Radiology Studies: DG Chest 2 View  Result Date: 01/11/2021 CLINICAL  DATA:  Pain and swelling of right lower leg and calf. Shortness of breath. EXAM: CHEST - 2 VIEW COMPARISON:  06/08/2014 FINDINGS: The heart size appears normal no pleural effusion or edema. No airspace opacities. Spondylosis identified throughout the mid and lower thoracic spine. IMPRESSION: No active cardiopulmonary abnormalities. Electronically Signed   By: Kerby Moors M.D.   On: 01/11/2021 21:30   CT Angio Chest PE W/Cm &/Or Wo Cm  Result Date: 01/12/2021 CLINICAL DATA:  Dyspnea EXAM: CT ANGIOGRAPHY CHEST WITH CONTRAST TECHNIQUE: Multidetector CT imaging of the chest was performed using the standard protocol during bolus administration of intravenous contrast. Multiplanar CT image reconstructions and MIPs were obtained to evaluate the vascular anatomy. CONTRAST:  166mL OMNIPAQUE IOHEXOL 350 MG/ML SOLN COMPARISON:  None. FINDINGS: Cardiovascular: There is adequate opacification of the pulmonary  arterial tree. There are numerous intraluminal filling defects identified within almost all segmental pulmonary arteries of the lungs bilaterally in keeping with acute pulmonary embolism. The embolic burden is large. The central pulmonary arteries are mildly enlarged in keeping with changes of pulmonary arterial hypertension. There is, however, no CT evidence of right heart strain. Global cardiac size within normal limits. No pericardial effusion. The thoracic aorta is unremarkable. Mediastinum/Nodes: No enlarged mediastinal, hilar, or axillary lymph nodes. Thyroid gland, trachea, and esophagus demonstrate no significant findings. Lungs/Pleura: Lungs are clear. No pleural effusion or pneumothorax. Upper Abdomen: A a rim calcified subcapsular lesion is partially visualized within the upper pole of the left kidney, incompletely evaluated on this examination but possibly representing a chronic subcapsular hematoma or abscess. No acute abnormality within the visualized upper abdomen. Musculoskeletal: No acute bone abnormality. No lytic or blastic bone lesion. Review of the MIP images confirms the above findings. IMPRESSION: Acute pulmonary embolism. Large embolic burden with enlargement the central pulmonary arteries in keeping with pulmonary arterial hypertension. No CT evidence of right heart strain. Rim calcified subcapsular lesion within the visualized upper pole the left kidney, incompletely imaged on this examination possibly representing a remote subcapsular hematoma or abscess. If indicated, this could be better assessed with dedicated CT or MRI imaging. These results were called by telephone at the time of interpretation on 01/12/2021 at 2:27 am to provider JADE SUNG , who verbally acknowledged these results. Electronically Signed   By: Fidela Salisbury MD   On: 01/12/2021 02:27   US Venous Img Lower Unilateral Right  Result Date: 01/11/2021 CLINICAL DATA:  Leg swelling and calf pain. EXAM: RIGHT LOWER EXTREMITY  VENOUS DOPPLER ULTRASOUND TECHNIQUE: Gray-scale sonography with compression, as well as color and duplex ultrasound, were performed to evaluate the deep venous system(s) from the level of the common femoral vein through the popliteal and proximal calf veins. COMPARISON:  None. FINDINGS: VENOUS Occlusive thrombus of the popliteal and calf veins with lack of compressibility. Normal compressibility of the common femoral and superficial femoral veins. There is also nonocclusive thrombus in the saphenous vein from the mid thigh through the mid calf. Included profundus femora is vein is patent. Limited views of the contralateral common femoral vein are unremarkable. OTHER None. Limitations: none IMPRESSION: 1. Positive for DVT with occlusive thrombus involving the popliteal and calf veins. 2. Superficial nonocclusive thrombus in the saphenous vein from the thigh to the calf. These results were called by telephone at the time of interpretation on 01/11/2021 at 11:17 pm to provider [facility] , who verbally acknowledged these results. Electronically Signed   By: Keith Rake M.D.   On: 01/11/2021 23:18  Marzetta Board, MD, PhD [facility]  Between 7 am - 7 pm I am available, please contact me via Amion (for emergencies) or Securechat (non urgent messages)  Between 7 pm - 7 am I am not available, please contact night coverage MD/APP via Amion

## 2021-01-13 ENCOUNTER — Encounter: Payer: Self-pay | Admitting: Vascular Surgery

## 2021-01-13 DIAGNOSIS — R600 Localized edema: Secondary | ICD-10-CM

## 2021-01-13 DIAGNOSIS — I2699 Other pulmonary embolism without acute cor pulmonale: Secondary | ICD-10-CM

## 2021-01-13 LAB — CBC
HCT: 37.4 % — ABNORMAL LOW (ref 39.0–52.0)
Hemoglobin: 12.9 g/dL — ABNORMAL LOW (ref 13.0–17.0)
MCH: 31.6 pg (ref 26.0–34.0)
MCHC: 34.5 g/dL (ref 30.0–36.0)
MCV: 91.7 fL (ref 80.0–100.0)
Platelets: 228 10*3/uL (ref 150–400)
RBC: 4.08 MIL/uL — ABNORMAL LOW (ref 4.22–5.81)
RDW: 12.5 % (ref 11.5–15.5)
WBC: 7.3 10*3/uL (ref 4.0–10.5)
nRBC: 0 % (ref 0.0–0.2)

## 2021-01-13 LAB — ECHOCARDIOGRAM COMPLETE
AV Mean grad: 3 mmHg
AV Peak grad: 5 mmHg
Ao pk vel: 1.12 m/s
Area-P 1/2: 3.56 cm2
Height: 70 in
S' Lateral: 2.7 cm
Weight: 3982.4 oz

## 2021-01-13 LAB — HEPARIN LEVEL (UNFRACTIONATED)
Heparin Unfractionated: 0.21 IU/mL — ABNORMAL LOW (ref 0.30–0.70)
Heparin Unfractionated: 0.39 IU/mL (ref 0.30–0.70)

## 2021-01-13 MED ORDER — APIXABAN 5 MG PO TABS
10.0000 mg | ORAL_TABLET | Freq: Two times a day (BID) | ORAL | Status: DC
  Administered 2021-01-13: 10 mg via ORAL
  Filled 2021-01-13: qty 2

## 2021-01-13 MED ORDER — APIXABAN 5 MG PO TABS: 5.0000 mg | ORAL_TABLET | Freq: Two times a day (BID) | ORAL | Status: DC

## 2021-01-13 MED ORDER — HEPARIN BOLUS VIA INFUSION
1500.0000 [IU] | Freq: Once | INTRAVENOUS | Status: AC
  Administered 2021-01-13: 1500 [IU] via INTRAVENOUS
  Filled 2021-01-13: qty 1500

## 2021-01-13 MED ORDER — APIXABAN (ELIQUIS) VTE STARTER PACK (10MG AND 5MG): ORAL_TABLET | ORAL | 0 refills | Status: DC

## 2021-01-13 NOTE — Progress Notes (Signed)
[facility]  Daily Progress Note   Subjective  -   Feels better.  Has not really walked much so does not know if he will have difficulty with activity.  Overall feels like his breathing is improved.  Not requiring oxygen.  Oxygen levels are excellent.  Objective Vitals:   01/12/21 1929 01/13/21 0500 01/13/21 0756 01/13/21 1159  BP: 116/72 140/84 (!) 154/93 (!) 150/88  Pulse: 66 67 60 64  Resp: 19 18 18 18   Temp: 98 F (36.7 C) 98.1 F (36.7 C) 98.1 F (36.7 C) 97.8 F (36.6 C)  TempSrc:  Oral Oral   SpO2: 97% 98% 95% 96%  Weight:      Height:        Intake/Output Summary (Last 24 hours) at 01/13/2021 1348 Last data filed at 01/13/2021 0950 Gross per 24 hour  Intake 2893.9 ml  Output --  Net 2893.9 ml    PULM  CTAB CV  RRR VASC  access site is clean, dry, and intact  Laboratory CBC    Component Value Date/Time   WBC 7.3 01/13/2021 0033   HGB 12.9 (L) 01/13/2021 0033   HCT 37.4 (L) 01/13/2021 0033   PLT 228 01/13/2021 0033    BMET    Component Value Date/Time   NA 136 01/12/2021 0251   K 3.8 01/12/2021 0251   CL 106 01/12/2021 0251   CO2 23 01/12/2021 0251   GLUCOSE 90 01/12/2021 0251   BUN 20 01/12/2021 0251   CREATININE 1.17 01/12/2021 0251   CALCIUM 8.2 (L) 01/12/2021 0251   GFRNONAA >60 01/12/2021 0251   GFRAA 70 12/12/2007 0921    Assessment/Planning: POD #1 s/p pulmonary thrombectomy  Feeling well.  Has not been up and walking much so not sure if he will have shortness of breath with activity Okay from vascular standpoint to transition to oral anticoagulation and potentially discharge later today or tomorrow. Can remove PAD. Can follow-up in our office in 2 to 3 weeks   Leotis Pain  01/13/2021, 1:48 PM

## 2021-01-13 NOTE — Discharge Summary (Signed)
Physician Discharge Summary  Alejandro Decker VOH:[id number] DOB: 28-May-1961 DOA: 01/11/2021  PCP: Ria Bush, MD  Admit date: 01/11/2021 Discharge date: 01/13/2021  Admitted From: home Disposition:  home  Recommendations for Outpatient Follow-up:  Follow up with PCP in 1-2 weeks  Home Health: none Equipment/Devices: none  Discharge Condition: stable CODE STATUS: Full code Diet recommendation: regular  HPI: Per admitting MD, Alejandro Decker is a 60 y.o. male with medical history significant for GERD and dyslipidemia, who presented to the emergency room with acute onset of right lower extremity swelling and pain that started on Friday night as well as dyspnea which has been going for a couple weeks, mainly on exertion.  He denies any cough or hemoptysis.  He admitted to occasional wheezing.  No fever or chills.  No chest pain or palpitations.  No bleeding diathesis.  No nausea or vomiting or abdominal pain.  He denies any headache or dizziness or blurred vision.  No paresthesias or focal muscle weakness.    Hospital Course / Discharge diagnoses: Principal Problem Acute PE/DVT-CT showing large clot burden and pulmonary hypertension.  There is no right heart strain based on the CT. given signs of pulmonary hypertension vascular surgery was consulted and he was taken to the OR on 6/8 and is status post thrombolysis and mechanical thrombectomy.  He recovered well postprocedure, he was able to walk in the hallway on room air, with no difficulties, no chest pain, no shortness of breath and will be discharged home in stable condition.  He was initially maintained on heparin and was transitioned to Eliquis upon discharge.  He also underwent a 2D echo which showed normal EF 60 to 69%, grade 1 diastolic dysfunction, normal RV.   Active Problems CKD 3 A -creatinine 1.4-1.5 back in 2020, currently at baseline, 1/.17 on dc Obesity-BMI 35, he would benefit from weight loss  Sepsis ruled  out   Discharge Instructions   Allergies as of 01/13/2021   No Known Allergies      Medication List     TAKE these medications    Apixaban Starter Pack (10mg  and 5mg ) Commonly known as: ELIQUIS STARTER PACK Take as directed on package: start with two-5mg  tablets twice daily for 7 days. On day 8, switch to one-5mg  tablet twice daily.   cetirizine 10 MG tablet Commonly known as: ZYRTEC Take 10 mg by mouth daily.   diphenhydrAMINE 25 mg capsule Commonly known as: BENADRYL Take 50 mg by mouth at bedtime as needed.         Consultations: Vascular surgery  Procedures/Studies: Thrombolysis/mechanical thrombectomy for his PE 6/8  DG Chest 2 View  Result Date: 01/11/2021 CLINICAL DATA:  Pain and swelling of right lower leg and calf. Shortness of breath. EXAM: CHEST - 2 VIEW COMPARISON:  06/08/2014 FINDINGS: The heart size appears normal no pleural effusion or edema. No airspace opacities. Spondylosis identified throughout the mid and lower thoracic spine. IMPRESSION: No active cardiopulmonary abnormalities. Electronically Signed   By: Kerby Moors M.D.   On: 01/11/2021 21:30   CT Angio Chest PE W/Cm &/Or Wo Cm  Result Date: 01/12/2021 CLINICAL DATA:  Dyspnea EXAM: CT ANGIOGRAPHY CHEST WITH CONTRAST TECHNIQUE: Multidetector CT imaging of the chest was performed using the standard protocol during bolus administration of intravenous contrast. Multiplanar CT image reconstructions and MIPs were obtained to evaluate the vascular anatomy. CONTRAST:  192mL OMNIPAQUE IOHEXOL 350 MG/ML SOLN COMPARISON:  None. FINDINGS: Cardiovascular: There is adequate opacification of the pulmonary arterial tree.  There are numerous intraluminal filling defects identified within almost all segmental pulmonary arteries of the lungs bilaterally in keeping with acute pulmonary embolism. The embolic burden is large. The central pulmonary arteries are mildly enlarged in keeping with changes of pulmonary arterial  hypertension. There is, however, no CT evidence of right heart strain. Global cardiac size within normal limits. No pericardial effusion. The thoracic aorta is unremarkable. Mediastinum/Nodes: No enlarged mediastinal, hilar, or axillary lymph nodes. Thyroid gland, trachea, and esophagus demonstrate no significant findings. Lungs/Pleura: Lungs are clear. No pleural effusion or pneumothorax. Upper Abdomen: A a rim calcified subcapsular lesion is partially visualized within the upper pole of the left kidney, incompletely evaluated on this examination but possibly representing a chronic subcapsular hematoma or abscess. No acute abnormality within the visualized upper abdomen. Musculoskeletal: No acute bone abnormality. No lytic or blastic bone lesion. Review of the MIP images confirms the above findings. IMPRESSION: Acute pulmonary embolism. Large embolic burden with enlargement the central pulmonary arteries in keeping with pulmonary arterial hypertension. No CT evidence of right heart strain. Rim calcified subcapsular lesion within the visualized upper pole the left kidney, incompletely imaged on this examination possibly representing a remote subcapsular hematoma or abscess. If indicated, this could be better assessed with dedicated CT or MRI imaging. These results were called by telephone at the time of interpretation on 01/12/2021 at 2:27 am to provider JADE SUNG , who verbally acknowledged these results. Electronically Signed   By: Fidela Salisbury MD   On: 01/12/2021 02:27   PERIPHERAL VASCULAR CATHETERIZATION  Result Date: 01/12/2021 See surgical note for result.  US Venous Img Lower Unilateral Right  Result Date: 01/11/2021 CLINICAL DATA:  Leg swelling and calf pain. EXAM: RIGHT LOWER EXTREMITY VENOUS DOPPLER ULTRASOUND TECHNIQUE: Gray-scale sonography with compression, as well as color and duplex ultrasound, were performed to evaluate the deep venous system(s) from the level of the common femoral vein  through the popliteal and proximal calf veins. COMPARISON:  None. FINDINGS: VENOUS Occlusive thrombus of the popliteal and calf veins with lack of compressibility. Normal compressibility of the common femoral and superficial femoral veins. There is also nonocclusive thrombus in the saphenous vein from the mid thigh through the mid calf. Included profundus femora is vein is patent. Limited views of the contralateral common femoral vein are unremarkable. OTHER None. Limitations: none IMPRESSION: 1. Positive for DVT with occlusive thrombus involving the popliteal and calf veins. 2. Superficial nonocclusive thrombus in the saphenous vein from the thigh to the calf. These results were called by telephone at the time of interpretation on 01/11/2021 at 11:17 pm to provider [facility] , who verbally acknowledged these results. Electronically Signed   By: Keith Rake M.D.   On: 01/11/2021 23:18   ECHOCARDIOGRAM COMPLETE  Result Date: 01/13/2021    ECHOCARDIOGRAM REPORT   Patient Name:   Alejandro Decker Date of Exam: 01/12/2021 Medical Rec #:  [id number]      Height:       70.0 in Accession #:    [id number]     Weight:       248.9 lb Date of Birth:  1960/10/02      BSA:          2.291 m Patient Age:    84 years       BP:           156/99 mmHg Patient Gender: M              HR:  64 bpm. Exam Location:  [facility] Procedure: 2D Echo, Color Doppler and Cardiac Doppler Indications:     Pulmonary embolus I26.09  History:         Patient has no prior history of Echocardiogram examinations.                  Risk Factors:Dyslipidemia. Incomplete RBBB.  Sonographer:     Sherrie Sport RDCS (AE) Referring Phys:  [id number] Andrew Diagnosing Phys: Neoma Laming MD IMPRESSIONS  1. Left ventricular ejection fraction, by estimation, is 60 to 65%. The left ventricle has normal function. The left ventricle has no regional wall motion abnormalities. There is moderate concentric left ventricular hypertrophy. Left ventricular diastolic  parameters are consistent with Grade I diastolic dysfunction (impaired relaxation).  2. Right ventricular systolic function is normal. The right ventricular size is normal.  3. Left atrial size was mildly dilated.  4. Right atrial size was mildly dilated.  5. The mitral valve is normal in structure. Trivial mitral valve regurgitation. No evidence of mitral stenosis.  6. The aortic valve is normal in structure. Aortic valve regurgitation is not visualized. No aortic stenosis is present.  7. The inferior vena cava is normal in size with greater than 50% respiratory variability, suggesting right atrial pressure of 3 mmHg. FINDINGS  Left Ventricle: Left ventricular ejection fraction, by estimation, is 60 to 65%. The left ventricle has normal function. The left ventricle has no regional wall motion abnormalities. The left ventricular internal cavity size was normal in size. There is  moderate concentric left ventricular hypertrophy. Left ventricular diastolic parameters are consistent with Grade I diastolic dysfunction (impaired relaxation). Right Ventricle: The right ventricular size is normal. No increase in right ventricular wall thickness. Right ventricular systolic function is normal. Left Atrium: Left atrial size was mildly dilated. Right Atrium: Right atrial size was mildly dilated. Pericardium: There is no evidence of pericardial effusion. Mitral Valve: The mitral valve is normal in structure. Trivial mitral valve regurgitation. No evidence of mitral valve stenosis. Tricuspid Valve: The tricuspid valve is normal in structure. Tricuspid valve regurgitation is trivial. No evidence of tricuspid stenosis. Aortic Valve: The aortic valve is normal in structure. Aortic valve regurgitation is not visualized. No aortic stenosis is present. Aortic valve mean gradient measures 3.0 mmHg. Aortic valve peak gradient measures 5.0 mmHg. Pulmonic Valve: The pulmonic valve was normal in structure. Pulmonic valve regurgitation is  not visualized. No evidence of pulmonic stenosis. Aorta: The aortic root is normal in size and structure. Venous: The inferior vena cava is normal in size with greater than 50% respiratory variability, suggesting right atrial pressure of 3 mmHg. IAS/Shunts: No atrial level shunt detected by color flow Doppler.  LEFT VENTRICLE PLAX 2D LVIDd:         4.90 cm Diastology LVIDs:         2.70 cm LV e' medial:    7.18 cm/s LV PW:         1.26 cm LV E/e' medial:  10.0 LV IVS:        0.93 cm LV e' lateral:   7.62 cm/s                        LV E/e' lateral: 9.4  RIGHT VENTRICLE RV Basal diam:  4.06 cm LEFT ATRIUM             Index       RIGHT ATRIUM  Index LA diam:        3.20 cm 1.40 cm/m  RA Area:     13.70 cm LA Vol (A2C):   66.8 ml 29.16 ml/m RA Volume:   32.00 ml  13.97 ml/m LA Vol (A4C):   57.4 ml 25.06 ml/m LA Biplane Vol: 61.7 ml 26.94 ml/m  AORTIC VALVE AV Vmax:           112.00 cm/s AV Vmean:          73.500 cm/s AV VTI:            0.184 m AV Peak Grad:      5.0 mmHg AV Mean Grad:      3.0 mmHg LVOT Vmax:         116.00 cm/s LVOT Vmean:        80.800 cm/s LVOT VTI:          0.258 m LVOT/AV VTI ratio: 1.40  AORTA Ao Root diam: 4.03 cm MITRAL VALVE               TRICUSPID VALVE MV Area (PHT): 3.56 cm    TR Peak grad:   11.8 mmHg MV Decel Time: 213 msec    TR Vmax:        172.00 cm/s MV E velocity: 72.00 cm/s MV A velocity: 92.50 cm/s  SHUNTS MV E/A ratio:  0.78        Systemic VTI: 0.26 m Neoma Laming MD Electronically signed by Neoma Laming MD Signature Date/Time: 01/13/2021/9:05:56 AM    Final      Subjective: - no chest pain, shortness of breath, no abdominal pain, nausea or vomiting.   Discharge Exam: BP (!) 150/88 (BP Location: Right Arm)   Pulse 64   Temp 97.8 F (36.6 C)   Resp 18   Ht 5\' 10"  (1.778 m)   Wt 112.9 kg   SpO2 96%   BMI 35.71 kg/m   General: Pt is alert, awake, not in acute distress Cardiovascular: RRR, S1/S2 +, no rubs, no gallops Respiratory: CTA bilaterally, no  wheezing, no rhonchi Abdominal: Soft, NT, ND, bowel sounds + Extremities: no edema, no cyanosis    The results of significant diagnostics from this hospitalization (including imaging, microbiology, ancillary and laboratory) are listed below for reference.     Microbiology: Recent Results (from the past 240 hour(s))  Resp Panel by RT-PCR (Flu A&B, Covid) Nasopharyngeal Swab     Status: None   Collection Time: 01/12/21 12:23 AM   Specimen: Nasopharyngeal Swab; Nasopharyngeal(NP) swabs in vial transport medium  Result Value Ref Range Status   SARS Coronavirus 2 by RT PCR NEGATIVE NEGATIVE Final    Comment: (NOTE) SARS-CoV-2 target nucleic acids are NOT DETECTED.  The SARS-CoV-2 RNA is generally detectable in upper respiratory specimens during the acute phase of infection. The lowest concentration of SARS-CoV-2 viral copies this assay can detect is 138 copies/mL. A negative result does not preclude SARS-Cov-2 infection and should not be used as the sole basis for treatment or other patient management decisions. A negative result may occur with  improper specimen collection/handling, submission of specimen other than nasopharyngeal swab, presence of viral mutation(s) within the areas targeted by this assay, and inadequate number of viral copies(<138 copies/mL). A negative result must be combined with clinical observations, patient history, and epidemiological information. The expected result is Negative.  Fact Sheet for Patients:  EntrepreneurPulse.com.au  Fact Sheet for Healthcare Providers:  IncredibleEmployment.be  This test is no t yet approved  or cleared by the [place] and  has been authorized for detection and/or diagnosis of SARS-CoV-2 by FDA under an Emergency Use Authorization (EUA). This EUA will remain  in effect (meaning this test can be used) for the duration of the COVID-19 declaration under Section 564(b)(1) of the Act,  21 U.S.C.section 360bbb-3(b)(1), unless the authorization is terminated  or revoked sooner.       Influenza A by PCR NEGATIVE NEGATIVE Final   Influenza B by PCR NEGATIVE NEGATIVE Final    Comment: (NOTE) The Xpert Xpress SARS-CoV-2/FLU/RSV plus assay is intended as an aid in the diagnosis of influenza from Nasopharyngeal swab specimens and should not be used as a sole basis for treatment. Nasal washings and aspirates are unacceptable for Xpert Xpress SARS-CoV-2/FLU/RSV testing.  Fact Sheet for Patients: EntrepreneurPulse.com.au  Fact Sheet for Healthcare Providers: IncredibleEmployment.be  This test is not yet approved or cleared by the [place] FDA and has been authorized for detection and/or diagnosis of SARS-CoV-2 by FDA under an Emergency Use Authorization (EUA). This EUA will remain in effect (meaning this test can be used) for the duration of the COVID-19 declaration under Section 564(b)(1) of the Act, 21 U.S.C. section 360bbb-3(b)(1), unless the authorization is terminated or revoked.  Performed at [facility], [address] [place], [place] [address]      Labs: Basic Metabolic Panel: Recent Labs  Lab 01/11/21 2100 01/12/21 0251  NA 135 136  K 4.0 3.8  CL 104 106  CO2 21* 23  GLUCOSE 94 90  BUN 20 20  CREATININE 1.36* 1.17  CALCIUM 8.5* 8.2*   Liver Function Tests: No results for input(s): AST, ALT, ALKPHOS, BILITOT, PROT, ALBUMIN in the last 168 hours. CBC: Recent Labs  Lab 01/11/21 2100 01/12/21 0251 01/13/21 0033  WBC 8.5 6.9 7.3  HGB 14.0 13.4 12.9*  HCT 40.5 39.3 37.4*  MCV 91.2 92.0 91.7  PLT 225 235 228   CBG: No results for input(s): GLUCAP in the last 168 hours. Hgb A1c No results for input(s): HGBA1C in the last 72 hours. Lipid Profile No results for input(s): CHOL, HDL, LDLCALC, TRIG, CHOLHDL, LDLDIRECT in the last 72 hours. Thyroid function studies No results for input(s):  TSH, T4TOTAL, T3FREE, THYROIDAB in the last 72 hours.  Invalid input(s): FREET3 Urinalysis No results found for: COLORURINE, APPEARANCEUR, LABSPEC, PHURINE, GLUCOSEU, HGBUR, BILIRUBINUR, KETONESUR, PROTEINUR, UROBILINOGEN, NITRITE, LEUKOCYTESUR  FURTHER DISCHARGE INSTRUCTIONS:   Get Medicines reviewed and adjusted: Please take all your medications with you for your next visit with your Primary MD   Laboratory/radiological data: Please request your Primary MD to go over all hospital tests and procedure/radiological results at the follow up, please ask your Primary MD to get all Hospital records sent to his/her office.   In some cases, they will be blood work, cultures and biopsy results pending at the time of your discharge. Please request that your primary care M.D. goes through all the records of your hospital data and follows up on these results.   Also Note the following: If you experience worsening of your admission symptoms, develop shortness of breath, life threatening emergency, suicidal or homicidal thoughts you must seek medical attention immediately by calling 911 or calling your MD immediately  if symptoms less severe.   You must read complete instructions/literature along with all the possible adverse reactions/side effects for all the Medicines you take and that have been prescribed to you. Take any new Medicines after you have completely understood and  accpet all the possible adverse reactions/side effects.    Do not drive when taking Pain medications or sleeping medications (Benzodaizepines)   Do not take more than prescribed Pain, Sleep and Anxiety Medications. It is not advisable to combine anxiety,sleep and pain medications without talking with your primary care practitioner   Special Instructions: If you have smoked or chewed Tobacco  in the last 2 yrs please stop smoking, stop any regular Alcohol  and or any Recreational drug use.   Wear Seat belts while driving.    Please note: You were cared for by a hospitalist during your hospital stay. Once you are discharged, your primary care physician will handle any further medical issues. Please note that NO REFILLS for any discharge medications will be authorized once you are discharged, as it is imperative that you return to your primary care physician (or establish a relationship with a primary care physician if you do not have one) for your post hospital discharge needs so that they can reassess your need for medications and monitor your lab values.  Time coordinating discharge: 35 minutes  SIGNED:  Marzetta Board, MD, PhD 01/13/2021, 2:48 PM

## 2021-01-13 NOTE — Progress Notes (Signed)
ANTICOAGULATION CONSULT NOTE - Initial Consult  Pharmacy Consult for Heparin  Indication: pulmonary embolus and DVT  No Known Allergies  Patient Measurements: Height: 5\' 10"  (177.8 cm) Weight: 112.9 kg (248 lb 14.4 oz) IBW/kg (Calculated) : 73 Heparin Dosing Weight: 96.5 kg   Vital Signs: Temp: 97.8 F (36.6 C) (06/09 1159) Temp Source: Oral (06/09 0756) BP: 150/88 (06/09 1159) Pulse Rate: 64 (06/09 1159)  Labs: Recent Labs    01/11/21 2100 01/12/21 0105 01/12/21 0251 01/12/21 0912 01/12/21 1831 01/13/21 0033 01/13/21 1022  HGB 14.0  --  13.4  --   --  12.9*  --   HCT 40.5  --  39.3  --   --  37.4*  --   PLT 225  --  235  --   --  228  --   APTT  --   --  28  --   --   --   --   LABPROT  --   --  12.6  --   --   --   --   INR  --   --  0.9  --   --   --   --   HEPARINUNFRC  --   --   --    < > 0.13* 0.21* 0.39  CREATININE 1.36*  --  1.17  --   --   --   --   TROPONINIHS 29* 27*  --   --   --   --   --    < > = values in this interval not displayed.     Estimated Creatinine Clearance: 86.6 mL/min (by C-G formula based on SCr of 1.17 mg/dL).   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   Past Week at Unknown time   diphenhydrAMINE (BENADRYL) 25 mg capsule Take 50 mg by mouth at bedtime as needed.       Assessment: Pharmacy consulted to dose heparin in this 60 year old male admitted with acute PE with large clot burden & pulmHTN 2/2 RLE DVT.  No prior anticoag noted.   CrCl = 73.2 ml/min  Heparin Dosing Weight: 96.5 kg   Date Time aPTT/HL Rate/Comment 6/08 0912 0.31  1350 un/hr    6/08 1831 0.13  1450 un/hr 6/09 0033 0.21  Bolus 1500u x1,  increase to 1650 u/hr    Baseline Labs: aPTT - 28s INR - 0.9 Hgb - 14>13.4 Plts - 225>235 Trop: 29>27   Goal of Therapy:  Heparin level 0.3-0.7 units/ml Monitor platelets by anticoagulation protocol: Yes   Plan:  6/09: HL 0.39. Therapeutic x1 Will continue heparin drip at 1650 units/hr.  Will check confirmatory HL in 6 hrs   Yorley Buch A Geremy Rister 01/13/2021,1:49 PM

## 2021-01-13 NOTE — Progress Notes (Signed)
ANTICOAGULATION CONSULT NOTE - Initial Consult  Pharmacy Consult for Heparin  Indication: pulmonary embolus and DVT  No Known Allergies  Patient Measurements: Height: 5\' 10"  (177.8 cm) Weight: 112.9 kg (248 lb 14.4 oz) IBW/kg (Calculated) : 73 Heparin Dosing Weight: 96.5 kg   Vital Signs: Temp: 98 F (36.7 C) (06/08 1929) BP: 116/72 (06/08 1929) Pulse Rate: 66 (06/08 1929)  Labs: Recent Labs    01/11/21 2100 01/12/21 0105 01/12/21 0251 01/12/21 0912 01/12/21 1831 01/13/21 0033  HGB 14.0  --  13.4  --   --  12.9*  HCT 40.5  --  39.3  --   --  37.4*  PLT 225  --  235  --   --  228  APTT  --   --  28  --   --   --   LABPROT  --   --  12.6  --   --   --   INR  --   --  0.9  --   --   --   HEPARINUNFRC  --   --   --  0.31 0.13* 0.21*  CREATININE 1.36*  --  1.17  --   --   --   TROPONINIHS 29* 27*  --   --   --   --      Estimated Creatinine Clearance: 86.6 mL/min (by C-G formula based on SCr of 1.17 mg/dL).   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   Past Week at Unknown time   diphenhydrAMINE (BENADRYL) 25 mg capsule Take 50 mg by mouth at bedtime as needed.       Assessment: Pharmacy consulted to dose heparin in this 60 year old male admitted with acute PE with large clot burden & pulmHTN 2/2 RLE DVT.  No prior anticoag noted.   CrCl = 73.2 ml/min  Heparin Dosing Weight: 96.5 kg   Date Time aPTT/HL Rate/Comment 6/08 0912 0.31  1350 un/hr       Baseline Labs: aPTT - 28s INR - 0.9 Hgb - 14>13.4 Plts - 225>235 Trop: 29>27   Goal of Therapy:  Heparin level 0.3-0.7 units/ml Monitor platelets by anticoagulation protocol: Yes   Plan:   6/9:  HL @ 0033 = 0.21 Will order heparin 1500 units IV bolus X 1 and increase drip rate to 1650 units/hr.  Will recheck HL 6 hrs after rate change.   Niyah Mamaril D 01/13/2021,2:33 AM

## 2021-01-13 NOTE — Plan of Care (Signed)
  Problem: Education: Goal: Knowledge of General Education information will improve Description: Including pain rating scale, medication(s)/side effects and non-pharmacologic comfort measures Outcome: Adequate for Discharge   Problem: Health Behavior/Discharge Planning: Goal: Ability to manage health-related needs will improve Outcome: Adequate for Discharge   Problem: Clinical Measurements: Goal: Ability to maintain clinical measurements within normal limits will improve Outcome: Adequate for Discharge Goal: Will remain free from infection Outcome: Adequate for Discharge Goal: Diagnostic test results will improve Outcome: Adequate for Discharge Goal: Respiratory complications will improve Outcome: Adequate for Discharge Goal: Cardiovascular complication will be avoided Outcome: Adequate for Discharge   Problem: Activity: Goal: Risk for activity intolerance will decrease Outcome: Adequate for Discharge   Problem: Nutrition: Goal: Adequate nutrition will be maintained Outcome: Adequate for Discharge   Problem: Coping: Goal: Level of anxiety will decrease Outcome: Adequate for Discharge   Problem: Elimination: Goal: Will not experience complications related to bowel motility Outcome: Adequate for Discharge Goal: Will not experience complications related to urinary retention Outcome: Adequate for Discharge   Problem: Pain Managment: Goal: General experience of comfort will improve Outcome: Adequate for Discharge   Problem: Safety: Goal: Ability to remain free from injury will improve Outcome: Adequate for Discharge   Problem: Skin Integrity: Goal: Risk for impaired skin integrity will decrease Outcome: Adequate for Discharge   Problem: Consults Goal: Venous Thromboembolism Patient Education Description: See Patient Education Module for education specifics. Outcome: Adequate for Discharge Goal: Diagnosis - Venous Thromboembolism (VTE) Description: Choose a  selection Outcome: Adequate for Discharge Note: PE (Pulmonary Embolism) Goal: Pharmacy Consult for anticoagulation Outcome: Adequate for Discharge Goal: Skin Care Protocol Initiated - if Braden Score 18 or less Description: If consults are not indicated, leave blank or document N/A Outcome: Adequate for Discharge Goal: Nutrition Consult-if indicated Outcome: Adequate for Discharge Goal: Diabetes Guidelines if Diabetic/Glucose > 140 Description: If diabetic or lab glucose is > 140 mg/dl - Initiate Diabetes/Hyperglycemia Guidelines & Document Interventions  Outcome: Adequate for Discharge   Problem: Phase I Progression Outcomes Goal: Pain controlled with appropriate interventions Outcome: Adequate for Discharge Goal: Dyspnea controlled at rest (PE) Outcome: Adequate for Discharge Goal: Tolerating diet Outcome: Adequate for Discharge Goal: Initial discharge plan identified Outcome: Adequate for Discharge Goal: Voiding-avoid urinary catheter unless indicated Outcome: Adequate for Discharge Goal: Hemodynamically stable Outcome: Adequate for Discharge Goal: Other Phase I Outcomes/Goals Outcome: Adequate for Discharge   Problem: Phase II Progression Outcomes Goal: Therapeutic drug levels for anticoagulation Outcome: Adequate for Discharge Goal: 02 sats trending upward/stable (PE) Outcome: Adequate for Discharge Goal: Discharge plan established Outcome: Adequate for Discharge Goal: Tolerating diet Outcome: Adequate for Discharge Goal: Other Phase II Outcomes/Goals Outcome: Adequate for Discharge   Problem: Phase III Progression Outcomes Goal: 02 sats stabilized Outcome: Adequate for Discharge Goal: Activity at appropriate level-compared to baseline Description: (UP IN CHAIR FOR HEMODIALYSIS) Outcome: Adequate for Discharge Goal: Discharge plan remains appropriate-arrangements made Outcome: Adequate for Discharge Goal: Other Phase III Outcomes/Goals Outcome: Adequate for  Discharge   Problem: Discharge Progression Outcomes Goal: Barriers To Progression Addressed/Resolved Outcome: Adequate for Discharge Goal: Discharge plan in place and appropriate Outcome: Adequate for Discharge Goal: Pain controlled with appropriate interventions Outcome: Adequate for Discharge Goal: Hemodynamically stable Outcome: Adequate for Discharge Goal: Complications resolved/controlled Outcome: Adequate for Discharge Goal: Tolerating diet Outcome: Adequate for Discharge Goal: Activity appropriate for discharge plan Outcome: Adequate for Discharge Goal: Other Discharge Outcomes/Goals Outcome: Adequate for Discharge

## 2021-01-13 NOTE — Progress Notes (Signed)
Patient discharged per orders, PIV/tele removed from patient. Discharge instructions reviewed, patient expressed understanding. Patient taken down to family vehicle via wheelchair by volunteer.

## 2021-01-24 ENCOUNTER — Ambulatory Visit: Payer: BC Managed Care – PPO | Admitting: Family Medicine

## 2021-01-24 ENCOUNTER — Encounter: Payer: Self-pay | Admitting: Family Medicine

## 2021-01-24 ENCOUNTER — Other Ambulatory Visit: Payer: Self-pay

## 2021-01-24 VITALS — BP 120/70 | HR 64 | Temp 97.8°F | Ht 70.0 in | Wt 244.0 lb

## 2021-01-24 DIAGNOSIS — N2889 Other specified disorders of kidney and ureter: Secondary | ICD-10-CM

## 2021-01-24 DIAGNOSIS — N289 Disorder of kidney and ureter, unspecified: Secondary | ICD-10-CM | POA: Diagnosis not present

## 2021-01-24 DIAGNOSIS — I8290 Acute embolism and thrombosis of unspecified vein: Secondary | ICD-10-CM

## 2021-01-24 DIAGNOSIS — I2699 Other pulmonary embolism without acute cor pulmonale: Secondary | ICD-10-CM | POA: Diagnosis not present

## 2021-01-24 DIAGNOSIS — I82431 Acute embolism and thrombosis of right popliteal vein: Secondary | ICD-10-CM

## 2021-01-24 LAB — POC URINALSYSI DIPSTICK (AUTOMATED)
Bilirubin, UA: NEGATIVE
Blood, UA: NEGATIVE
Glucose, UA: NEGATIVE
Ketones, UA: NEGATIVE
Leukocytes, UA: NEGATIVE
Nitrite, UA: NEGATIVE
Protein, UA: NEGATIVE
Spec Grav, UA: 1.025 (ref 1.010–1.025)
Urobilinogen, UA: 0.2 E.U./dL
pH, UA: 5 (ref 5.0–8.0)

## 2021-01-24 MED ORDER — APIXABAN (ELIQUIS) VTE STARTER PACK (10MG AND 5MG): ORAL_TABLET | ORAL | 0 refills | Status: DC

## 2021-01-24 MED ORDER — APIXABAN 5 MG PO TABS: 5.0000 mg | ORAL_TABLET | Freq: Two times a day (BID) | ORAL | 4 refills | Status: DC

## 2021-01-24 NOTE — Patient Instructions (Addendum)
Urine test today.  Call vascular clinic to schedule follow up after recent blood clot removal procedure. [phone] [facility] We will order imaging study to get a closer look at kidneys.  Good to see you today.

## 2021-01-24 NOTE — Progress Notes (Addendum)
Patient ID: Alejandro Decker, male    DOB: 08-12-1960, 60 y.o.   MRN: [id number]  This visit was conducted in person.  BP 120/70   Pulse 64   Temp 97.8 F (36.6 C) (Temporal)   Ht 5\' 10"  (1.778 m)   Wt 244 lb (110.7 kg)   SpO2 96%   BMI 35.01 kg/m    CC: hosp f/u visit  Subjective:   HPI: Alejandro Decker is a 60 y.o. male presenting on 01/24/2021 for Hospitalization Follow-up (Admitted on 01/11/21 to [facility], dx R leg swelling; acute DVT of popliteal vein or R LE. )   Recent hospitalization for acute RLE DVT with acute extensive bilateral PE with signs of pulm hypertension without R heart strain s/p thrombolysis and mechanical thrombectomy by VVS. Echo showed EF 60--65%, G1DD, normal RV function. Records reviewed. Heparin transitioned to eliquis. Planned VVS f/u in ~2 wks. Will need AC for 6-12 months vs lifelong.   Colon cancer screening - iFOB neg yearly  Prostate cancer screening - yearly PSA. No BPH symptom.  No fmhx cancers.  No fmhx blood clots.  Not on hormonal medications.  No recent prolonged car ride or plane ride.   First noticed shortness of breath May 21st. Subsequently noted RLE redness, swelling, warmth, tender.   Venous US to RLE IMPRESSION: 1. Positive for DVT with occlusive thrombus involving the popliteal and calf veins. 2. Superficial nonocclusive thrombus in the saphenous vein from the thigh to the calf.  CTA chest with contast IMPRESSION: Acute pulmonary embolism. Large embolic burden with enlargement the central pulmonary arteries in keeping with pulmonary arterial hypertension. No CT evidence of right heart strain. Rim calcified subcapsular lesion within the visualized upper pole the left kidney, incompletely imaged on this examination possibly representing a remote subcapsular hematoma or abscess. If indicated, this could be better assessed with dedicated CT or MRI imaging.   Admit date: 01/11/2021 Discharge date: 01/13/2021 TCM phone call not  completed  Admitted From: home Disposition:  home   Recommendations for Outpatient Follow-up:  Follow up with PCP in 1-2 weeks   Home Health: none Equipment/Devices: none   Discharge Condition: stable CODE STATUS: Full code Diet recommendation: regular     Relevant past medical, surgical, family and social history reviewed and updated as indicated. Interim medical history since our last visit reviewed. Allergies and medications reviewed and updated. Outpatient Medications Prior to Visit  Medication Sig Dispense Refill   cetirizine (ZYRTEC) 10 MG tablet Take 10 mg by mouth daily.     diphenhydrAMINE (BENADRYL) 25 mg capsule Take 50 mg by mouth at bedtime as needed.     APIXABAN (ELIQUIS) VTE STARTER PACK (10MG  AND 5MG ) Take as directed on package: start with two-5mg  tablets twice daily for 7 days. On day 8, switch to one-5mg  tablet twice daily. 1 each 0   No facility-administered medications prior to visit.     Per HPI unless specifically indicated in ROS section below Review of Systems Objective:  BP 120/70   Pulse 64   Temp 97.8 F (36.6 C) (Temporal)   Ht 5\' 10"  (1.778 m)   Wt 244 lb (110.7 kg)   SpO2 96%   BMI 35.01 kg/m   Wt Readings from Last 3 Encounters:  01/24/21 244 lb (110.7 kg)  01/12/21 248 lb 14.4 oz (112.9 kg)  12/22/20 247 lb (112 kg)      Physical Exam Vitals and nursing note reviewed.  Constitutional:  Appearance: Normal appearance. He is not ill-appearing.  Cardiovascular:     Rate and Rhythm: Normal rate and regular rhythm.     Pulses: Normal pulses.     Heart sounds: Normal heart sounds. No murmur heard. Pulmonary:     Effort: Pulmonary effort is normal. No respiratory distress.     Breath sounds: Normal breath sounds. No wheezing, rhonchi or rales.  Genitourinary:    Prostate: Not enlarged (20gm), not tender and no nodules present.     Rectum: Normal. No mass, tenderness, anal fissure or internal hemorrhoid. Normal anal tone.   Musculoskeletal:     Right lower leg: Edema (tr) present.     Left lower leg: No edema.  Skin:    General: Skin is warm and dry.     Findings: No erythema or rash.  Neurological:     Mental Status: He is alert.  Psychiatric:        Mood and Affect: Mood normal.        Behavior: Behavior normal.      Results for orders placed or performed in visit on 01/24/21  POCT Urinalysis Dipstick (Automated)  Result Value Ref Range   Color, UA yellow    Clarity, UA clear    Glucose, UA Negative Negative   Bilirubin, UA negative    Ketones, UA negative    Spec Grav, UA 1.025 1.010 - 1.025   Blood, UA negative    pH, UA 5.0 5.0 - 8.0   Protein, UA Negative Negative   Urobilinogen, UA 0.2 0.2 or 1.0 E.U./dL   Nitrite, UA negative    Leukocytes, UA Negative Negative   Lab Results  Component Value Date   CREATININE 1.17 01/12/2021   BUN 20 01/12/2021   NA 136 01/12/2021   K 3.8 01/12/2021   CL 106 01/12/2021   CO2 23 01/12/2021    Assessment & Plan:  This visit occurred during the SARS-CoV-2 public health emergency.  Safety protocols were in place, including screening questions prior to the visit, additional usage of staff PPE, and extensive cleaning of exam room while observing appropriate contact time as indicated for disinfecting solutions.   Problem List Items Addressed This Visit     Renal insufficiency    Latest Cr of 1.17, GFR >60 best to date presume after IV fluids in hospital.        Relevant Orders   POCT Urinalysis Dipstick (Automated) (Completed)   Acute pulmonary embolism (HCC)    S/p thrombolysis and mechanical thrombectomy now on eliquis.  Unprovoked as of now therefore recommend 6-12 months of anticoagulation, and consideration for lifelong.        Relevant Medications   apixaban (ELIQUIS) 5 MG TABS tablet   APIXABAN (ELIQUIS) VTE STARTER PACK (10MG  AND 5MG )   Venous thromboembolism (VTE) - Primary    UTD colon cancer screening, prostate cancer screening  (with DRE today), normal UA today.  See below for further evaluation, if unrevealing workup consider hematology evaluation.  rec RTC 6 mo f/u visit.        Relevant Medications   apixaban (ELIQUIS) 5 MG TABS tablet   APIXABAN (ELIQUIS) VTE STARTER PACK (10MG  AND 5MG )   Acute deep vein thrombosis (DVT) of popliteal vein of right lower extremity (HCC)    Unprovoked. Now on eliquis.        Relevant Medications   apixaban (ELIQUIS) 5 MG TABS tablet   APIXABAN (ELIQUIS) VTE STARTER PACK (10MG  AND 5MG )   Lesion of left  native kidney    Incidental finding on imaging.  Discussed further evaluation with imaging study. Will see if contrasted CT is approved. If not, consider [place] vs MRI.        Other Visit Diagnoses     Other specified disorders of kidney and ureter       Relevant Orders   CT ABDOMEN W CONTRAST        Meds ordered this encounter  Medications   apixaban (ELIQUIS) 5 MG TABS tablet    Sig: Take 1 tablet (5 mg total) by mouth 2 (two) times daily.    Dispense:  60 tablet    Refill:  4   APIXABAN (ELIQUIS) VTE STARTER PACK (10MG  AND 5MG )    Sig: Take as directed on package: start with two-5mg  tablets twice daily for 7 days. On day 8, switch to one-5mg  tablet twice daily.    Dispense:  1 each    Refill:  0   Orders Placed This Encounter  Procedures   CT ABDOMEN W CONTRAST    Standing Status:   Future    Standing Expiration Date:   01/28/2022    Order Specific Question:   If indicated for the ordered procedure, I authorize the administration of contrast media per Radiology protocol    Answer:   Yes    Order Specific Question:   Preferred imaging location?    Answer:   [facility]    Order Specific Question:   Is Oral Contrast requested for this exam?    Answer:   Yes, Per Radiology protocol   POCT Urinalysis Dipstick (Automated)     Patient Instructions  Urine test today.  Call vascular clinic to schedule follow up after recent blood clot removal  procedure. [phone] [facility] We will order imaging study to get a closer look at kidneys.  Good to see you today.    Follow up plan: Return if symptoms worsen or fail to improve.  Ria Bush, MD

## 2021-01-25 DIAGNOSIS — N289 Disorder of kidney and ureter, unspecified: Secondary | ICD-10-CM | POA: Insufficient documentation

## 2021-01-25 DIAGNOSIS — I82431 Acute embolism and thrombosis of right popliteal vein: Secondary | ICD-10-CM

## 2021-01-25 HISTORY — DX: Acute embolism and thrombosis of right popliteal vein: I82.431

## 2021-01-25 NOTE — Assessment & Plan Note (Signed)
Unprovoked. Now on eliquis.

## 2021-01-25 NOTE — Assessment & Plan Note (Signed)
UTD colon cancer screening, prostate cancer screening (with DRE today), normal UA today.  See below for further evaluation, if unrevealing workup consider hematology evaluation.  rec RTC 6 mo f/u visit.

## 2021-01-25 NOTE — Assessment & Plan Note (Addendum)
S/p thrombolysis and mechanical thrombectomy now on eliquis.  Unprovoked as of now therefore recommend 6-12 months of anticoagulation, and consideration for lifelong.

## 2021-01-25 NOTE — Assessment & Plan Note (Signed)
Latest Cr of 1.17, GFR >60 best to date presume after IV fluids in hospital.

## 2021-01-25 NOTE — Assessment & Plan Note (Addendum)
Incidental finding on imaging.  Discussed further evaluation with imaging study. Will see if contrasted CT is approved. If not, consider [place] vs MRI.

## 2021-01-28 NOTE — Addendum Note (Signed)
Addended by: Ria Bush on: 01/28/2021 03:59 PM   Modules accepted: Orders

## 2021-01-31 ENCOUNTER — Telehealth: Payer: Self-pay | Admitting: Family Medicine

## 2021-01-31 NOTE — Telephone Encounter (Signed)
A representative from [facility] called stating patient is scheduled for CT at [place] and they need the orders faxed to the office. The fax number is [phone].

## 2021-01-31 NOTE — Telephone Encounter (Signed)
Per notes in the chart it looks like Pre auth was done for [facility] location not [facility]. No mentioning about this. Will need to change location and verify this information. I am not able to speak to anyone today on the phone.

## 2021-02-01 NOTE — Telephone Encounter (Signed)
Orders faxed. [employer] insurance set this up and this location is approved vs [facility].

## 2021-02-01 NOTE — Telephone Encounter (Signed)
Per notes from patient today when Mendel Ryder spoke with him- "Spoke to pt, his insurance called him yesterday to let him know they had locations in [place] that would be cheaper for him to get his CT one at rather than [facility]. Per pt, his insurance will call him back today with an appt & location." Will update this with insurance and then fax the order

## 2021-02-04 ENCOUNTER — Ambulatory Visit (INDEPENDENT_AMBULATORY_CARE_PROVIDER_SITE_OTHER): Payer: BC Managed Care – PPO | Admitting: Nurse Practitioner

## 2021-02-04 ENCOUNTER — Telehealth: Payer: Self-pay | Admitting: Family Medicine

## 2021-02-04 ENCOUNTER — Encounter (INDEPENDENT_AMBULATORY_CARE_PROVIDER_SITE_OTHER): Payer: Self-pay | Admitting: Nurse Practitioner

## 2021-02-04 ENCOUNTER — Other Ambulatory Visit: Payer: Self-pay

## 2021-02-04 VITALS — BP 131/89 | HR 60 | Ht 70.0 in | Wt 244.0 lb

## 2021-02-04 DIAGNOSIS — N289 Disorder of kidney and ureter, unspecified: Secondary | ICD-10-CM

## 2021-02-04 DIAGNOSIS — E785 Hyperlipidemia, unspecified: Secondary | ICD-10-CM

## 2021-02-04 DIAGNOSIS — N2889 Other specified disorders of kidney and ureter: Secondary | ICD-10-CM

## 2021-02-04 DIAGNOSIS — I2699 Other pulmonary embolism without acute cor pulmonale: Secondary | ICD-10-CM | POA: Diagnosis not present

## 2021-02-04 DIAGNOSIS — I82431 Acute embolism and thrombosis of right popliteal vein: Secondary | ICD-10-CM

## 2021-02-04 NOTE — Telephone Encounter (Signed)
Order has been faxed. Auth number stays the same for this test.

## 2021-02-04 NOTE — Progress Notes (Signed)
Subjective:    Patient ID: Alejandro Decker, male    DOB: 1961-01-23, 60 y.o.   MRN: [id number] Chief Complaint  Patient presents with   Follow-up    2-3 wk FU after pulmonary Throm     Alejandro Decker is a 60 year old male that presents today for follow-up evaluation following bilateral pulmonary embolectomy.  He was also subsequently found to have right lower extremity DVT in the popliteal and calf area.  There is also a superficial thrombophlebitis the patient denies any family history of DVTs.  No evidence of cancer.  He denies any prolonged periods of immobility.  He denies any injuries or recent COVID infections.  Today he notes that he is not suffering from any shortness of breath, chest pain or fatigue.  He does endorse having some swelling in the right lower extremity at times.  The patient has essentially resumed normal activities such as returning to the gym.  Overall he is doing well.  He continues to take his Eliquis without issue.   Review of Systems  Cardiovascular:  Positive for leg swelling.  All other systems reviewed and are negative.     Objective:   Physical Exam Vitals reviewed.  HENT:     Head: Normocephalic.  Cardiovascular:     Rate and Rhythm: Normal rate.     Pulses: Normal pulses.  Pulmonary:     Effort: Pulmonary effort is normal.  Musculoskeletal:        General: Normal range of motion.  Skin:    General: Skin is warm and dry.  Neurological:     Mental Status: He is alert and oriented to person, place, and time.  Psychiatric:        Mood and Affect: Mood normal.        Behavior: Behavior normal.        Thought Content: Thought content normal.        Judgment: Judgment normal.    BP 131/89   Pulse 60   Ht 5\' 10"  (1.778 m)   Wt 244 lb (110.7 kg)   BMI 35.01 kg/m   Past Medical History:  Diagnosis Date   GERD (gastroesophageal reflux disease)    Hyperlipemia    statin caused bump in LFTs, saw cards, rec treatment if LDL >130   Incomplete  RBBB 05/08/2015   Venous thromboembolism (VTE) 01/2021   RLE DVT + extensive bilateral PE (eliquis)    Social History   Socioeconomic History   Marital status: Married    Spouse name: Not on file   Number of children: 2   Years of education: Not on file   Highest education level: Not on file  Occupational History   Occupation: Chief Financial Officer, [employer]    Employer: [employer]    Comment: [employer]  Tobacco Use   Smoking status: Never   Smokeless tobacco: Never  Substance and Sexual Activity   Alcohol use: Yes    Alcohol/week: 6.0 standard drinks    Types: 6 drink(s) per week    Comment: weekly   Drug use: No   Sexual activity: Yes  Other Topics Concern   Not on file  Social History Narrative   Caffeine: 4-5 cups coffee/day   Lives with wife and 2 daughters, 3 dogs   Occupation: Freight forwarder at [employer]: works out 6d/wk, cardio and [employer]   Diet: some water, fruits/vegetables daily, no fast food   Social Determinants of Radio broadcast assistant Strain:  Not on file  Food Insecurity: Not on file  Transportation Needs: Not on file  Physical Activity: Not on file  Stress: Not on file  Social Connections: Not on file  Intimate Partner Violence: Not on file    Past Surgical History:  Procedure Laterality Date   CARDIOVASCULAR STRESS TEST  05/2015   ETT - WNL, EF 58% (Dr Einar Gip)   CT CTA CORONARY W/CA SCORE W/CM &/OR WO/CM  05/2015   score of 6   LASIK  11/17/2009   Dr. Gaspar Bidding, [place]   PULMONARY THROMBECTOMY Bilateral 01/2021   TPA thrombolysis and thrombectomy for bilateral PE with R heart strain and hypoxia [name])   PULMONARY THROMBECTOMY Bilateral 01/12/2021   Procedure: PULMONARY THROMBECTOMY;  Surgeon: Katha Cabal, MD;  Location: [facility];  Service: Cardiovascular;  Laterality: Bilateral;   TONSILLECTOMY  7 yoa   US ECHOCARDIOGRAPHY  05/2015   mild concentric LVH, EF 66%, mildly dilated aortic root  4.1cm    Family History  Problem Relation Age of Onset   Benign prostatic hyperplasia Father    Diabetes Maternal Grandfather    Heart failure Maternal Grandfather    Parkinsonism Paternal Grandmother    Stroke Paternal Grandfather    Coronary artery disease Neg Hx    Cancer Neg Hx     No Known Allergies  CBC Latest Ref Rng & Units 01/13/2021 01/12/2021 01/11/2021  WBC 4.0 - 10.5 K/uL 7.3 6.9 8.5  Hemoglobin 13.0 - 17.0 g/dL 12.9(L) 13.4 14.0  Hematocrit 39.0 - 52.0 % 37.4(L) 39.3 40.5  Platelets 150 - 400 K/uL 228 235 225      CMP     Component Value Date/Time   NA 136 01/12/2021 0251   K 3.8 01/12/2021 0251   CL 106 01/12/2021 0251   CO2 23 01/12/2021 0251   GLUCOSE 90 01/12/2021 0251   BUN 20 01/12/2021 0251   CREATININE 1.17 01/12/2021 0251   CALCIUM 8.2 (L) 01/12/2021 0251   PROT 6.2 12/13/2020 0756   ALBUMIN 4.2 12/13/2020 0756   AST 25 12/13/2020 0756   ALT 28 12/13/2020 0756   ALKPHOS 76 12/13/2020 0756   BILITOT 0.8 12/13/2020 0756   GFRNONAA >60 01/12/2021 0251   GFRAA 70 12/12/2007 0921     No results found.     Assessment & Plan:   1. Acute pulmonary embolism without acute cor pulmonale, unspecified pulmonary embolism type [facility]) The patient is doing well post lobectomy.  He denies any worsening chest pain or shortness of breath.  However, the cause of this pulmonary embolism is still unknown.  Because of this I discussed referral to a hematologist for work-up for any hypercoagulable conditions.  The patient is agreeable to this.   2. Acute deep vein thrombosis (DVT) of popliteal vein of right lower extremity (HCC) I discussed with the patient conservative therapy tactics to try to reduce postphlebitic symptoms.  The patient should utilize medical grade compression stockings 20 to 30 mmHg daily.  He should place these in the morning and remove them in the evening.  He is specifically advised not to sleep in the stockings.  Elevation and exercise are also  encouraged.  We will have the patient return in 6 months with noninvasive studies to evaluate a new baseline.  Patient is advised to contact us or PCP for refills of Eliquis.  3. Hyperlipidemia, unspecified hyperlipidemia type Continue statin as ordered and reviewed, no changes at this time    Current Outpatient  Medications on File Prior to Visit  Medication Sig Dispense Refill   apixaban (ELIQUIS) 5 MG TABS tablet Take 1 tablet (5 mg total) by mouth 2 (two) times daily. 60 tablet 4   APIXABAN (ELIQUIS) VTE STARTER PACK (10MG  AND 5MG ) Take as directed on package: start with two-5mg  tablets twice daily for 7 days. On day 8, switch to one-5mg  tablet twice daily. 1 each 0   cetirizine (ZYRTEC) 10 MG tablet Take 10 mg by mouth daily.     diphenhydrAMINE (BENADRYL) 25 mg capsule Take 50 mg by mouth at bedtime as needed.     No current facility-administered medications on file prior to visit.    There are no Patient Instructions on file for this visit. No follow-ups on file.   Kris Hartmann, NP

## 2021-02-04 NOTE — Telephone Encounter (Signed)
New order placed. Will forward to Anastasiya.

## 2021-02-04 NOTE — Telephone Encounter (Signed)
Ms Alejandro Decker called in from [facility] stating that Mr. Alejandro Decker is set to have a CT Scan of abdomen done on Tuesday @ [facility] @ [address]. And the order says w/ contrast , but it needs to be w/ and w/o contrast. And the new order can be faxed to [phone]

## 2021-02-08 ENCOUNTER — Telehealth: Payer: Self-pay | Admitting: Family Medicine

## 2021-02-08 DIAGNOSIS — N289 Disorder of kidney and ureter, unspecified: Secondary | ICD-10-CM

## 2021-02-08 DIAGNOSIS — N2889 Other specified disorders of kidney and ureter: Secondary | ICD-10-CM | POA: Diagnosis not present

## 2021-02-08 NOTE — Telephone Encounter (Signed)
New order placed. Will need to verify that prior auth covers this change as well.

## 2021-02-08 NOTE — Telephone Encounter (Signed)
Noted! Thank you

## 2021-02-08 NOTE — Telephone Encounter (Signed)
Alejandro Decker called in due to they are needing a new CT SCAN  order TODAY  due to protocol by the radiologist they are needing it be CT SCAN of the abdomen and pelvis w/ and w.o  contrast and [place] 74[address] and they would need a new PA from [facility] due to the old CPT code is on there

## 2021-02-08 NOTE — Telephone Encounter (Signed)
Patient was scheduled for today at [facility] called this morning stating the order needed to be changed again from the CT Abdomen to the CT Abd Pelvis W/WO - I called back to verify this information.  They stated that the patient called and rescheduled his scan from today to Thursday 02/10/21  I am working on this precert trying to get it approved - I will keep you posted.

## 2021-02-08 NOTE — Telephone Encounter (Signed)
Dr Danise Mina, please place new order ASAP for CT Abd Pelvis W & W/O contrast  Thanks!

## 2021-02-08 NOTE — Telephone Encounter (Signed)
Unable to get approved for CT Abd/Pelvis W/WO  Called back and spoke with CT Tech - they okayed for CT Abd W/WO to be done wihout Pelvis as long as Dr Darlin Priestly is aware that he will be missing about 1/3 of the ureter in the pictures. Dr Darnell Level is okay with proceeding with what we can get approved (CT Abd W/WO)  [facility] is going to call the patient back and see if they are able to work him back in today as they cancelled his appt d/t issues with precert. If unable to schedule to today then he will remained scheduled for 02/10/21 at 11:15  CT Abd W/WO approved through [facility] Auth# [id number] Exp 03/09/21  All CPTs covered 03794 CAT scans of abdomen  74150 CT, abdomen, wo contrast  74160 Contrast CAT scan of abdomen   FYI to Dr Danise Mina  Nothing further needed.

## 2021-02-08 NOTE — Addendum Note (Signed)
Addended by: Ria Bush on: 02/08/2021 10:50 AM   Modules accepted: Orders

## 2021-02-08 NOTE — Telephone Encounter (Signed)
This should have been taken care of on Friday. Plz ensure with [facility] this was done.

## 2021-02-09 ENCOUNTER — Telehealth: Payer: Self-pay | Admitting: Family Medicine

## 2021-02-09 ENCOUNTER — Inpatient Hospital Stay: Payer: BC Managed Care – PPO | Attending: Oncology | Admitting: Oncology

## 2021-02-09 ENCOUNTER — Inpatient Hospital Stay: Payer: BC Managed Care – PPO

## 2021-02-09 ENCOUNTER — Encounter: Payer: Self-pay | Admitting: Oncology

## 2021-02-09 ENCOUNTER — Other Ambulatory Visit: Payer: Self-pay

## 2021-02-09 VITALS — BP 132/81 | HR 65 | Temp 98.3°F | Resp 18 | Wt 247.5 lb

## 2021-02-09 DIAGNOSIS — M7989 Other specified soft tissue disorders: Secondary | ICD-10-CM | POA: Diagnosis not present

## 2021-02-09 DIAGNOSIS — E785 Hyperlipidemia, unspecified: Secondary | ICD-10-CM | POA: Diagnosis not present

## 2021-02-09 DIAGNOSIS — Z8249 Family history of ischemic heart disease and other diseases of the circulatory system: Secondary | ICD-10-CM | POA: Insufficient documentation

## 2021-02-09 DIAGNOSIS — I2699 Other pulmonary embolism without acute cor pulmonale: Secondary | ICD-10-CM | POA: Insufficient documentation

## 2021-02-09 DIAGNOSIS — Z833 Family history of diabetes mellitus: Secondary | ICD-10-CM | POA: Insufficient documentation

## 2021-02-09 DIAGNOSIS — Z86718 Personal history of other venous thrombosis and embolism: Secondary | ICD-10-CM | POA: Insufficient documentation

## 2021-02-09 DIAGNOSIS — Z7289 Other problems related to lifestyle: Secondary | ICD-10-CM | POA: Insufficient documentation

## 2021-02-09 DIAGNOSIS — Z7901 Long term (current) use of anticoagulants: Secondary | ICD-10-CM | POA: Diagnosis not present

## 2021-02-09 DIAGNOSIS — I2721 Secondary pulmonary arterial hypertension: Secondary | ICD-10-CM | POA: Diagnosis not present

## 2021-02-09 DIAGNOSIS — I10 Essential (primary) hypertension: Secondary | ICD-10-CM | POA: Diagnosis not present

## 2021-02-09 DIAGNOSIS — K219 Gastro-esophageal reflux disease without esophagitis: Secondary | ICD-10-CM | POA: Insufficient documentation

## 2021-02-09 DIAGNOSIS — Z8269 Family history of other diseases of the musculoskeletal system and connective tissue: Secondary | ICD-10-CM | POA: Insufficient documentation

## 2021-02-09 DIAGNOSIS — R2241 Localized swelling, mass and lump, right lower limb: Secondary | ICD-10-CM

## 2021-02-09 DIAGNOSIS — Z823 Family history of stroke: Secondary | ICD-10-CM | POA: Insufficient documentation

## 2021-02-09 LAB — ANTITHROMBIN III: AntiThromb III Func: 112 % (ref 75–120)

## 2021-02-09 NOTE — Progress Notes (Signed)
Hematology Consult [facility]  [phone] [phone]  Patient Care Team: Ria Bush, MD as PCP - General (Family Medicine)   Name of the patient: Alejandro Decker  [id number]  30-Dec-1960   Date of visit: 02/09/2021  Diagnosis:  Encounter Diagnosis  Name Primary?   Acute pulmonary embolism without acute cor pulmonale, unspecified pulmonary embolism type [facility]) Yes    Chief Complaint: Pulmonary Embolism   Mr Parlato is a 60 year old male with PMH significant for GERD, dyslipidemia, and seasonal allergies.   Patient was recently hospitalized from 01/11/2021 through 01/13/2021 due to a complaint of shortness of breath and right leg pain around [facility] Day weekend.  Work-up included imaging which revealed extensive PE causing pulmonary hypertension and superficial DVT to right lower extremity in the popliteal and calf area. He underwent bilateral pulmonary embolectomy on 01/12/2021. He was started on a heparin drip in the hospital, and was discharged on Eliquis.  He denies family history of clotting disorders/blood cancers.  He denies any precipitating events including prolonged periods of immobility, recent COVID infection or surgery.  He was referred to hematology for hypercoagulable work-up.  He had a repeat CT scan yesterday on 02/08/2021 that has not yet resulted.   He is followed up with Vascular and was evaluated on 02/04/2021.  It was recommended he use medical grade compression stockings and elevation.  He has resumed normal everyday activities including exercising and working. He has noticed improvement in his right lower extremity swelling and denies pain.  Otherwise, he is asymptomatic.  He denies any concerns with Eliquis.  Denies any bleeding.   Subjective Data: Review of Systems  Constitutional:  Negative for fever, malaise/fatigue and weight loss.  HENT:  Negative for congestion and hearing loss.   Eyes:  Negative for  blurred vision and double vision.  Respiratory:  Negative for cough.   Cardiovascular:  Positive for leg swelling (Right Leg). Negative for chest pain and palpitations.  Gastrointestinal:  Negative for abdominal pain, constipation, diarrhea, nausea and vomiting.  Genitourinary:  Negative for frequency and urgency.  Skin:  Negative for rash.  Neurological:  Negative for dizziness, tingling and headaches.  Endo/Heme/Allergies:  Does not bruise/bleed easily.  Psychiatric/Behavioral:  Negative for depression. The patient is not nervous/anxious and does not have insomnia.    ECOG: 0 - Asymptomatic  Past Medical History:  Diagnosis Date   GERD (gastroesophageal reflux disease)    Hyperlipemia    statin caused bump in LFTs, saw cards, rec treatment if LDL >130   Incomplete RBBB 05/08/2015   Venous thromboembolism (VTE) 01/2021   RLE DVT + extensive bilateral PE (eliquis)   Past Surgical History:  Procedure Laterality Date   CARDIOVASCULAR STRESS TEST  05/2015   ETT - WNL, EF 58% (Dr Einar Gip)   CT CTA CORONARY W/CA SCORE W/CM &/OR WO/CM  05/2015   score of 6   LASIK  11/17/2009   Dr. Gaspar Bidding, [place], [facility]   PULMONARY THROMBECTOMY Bilateral 01/2021   TPA thrombolysis and thrombectomy for bilateral PE with R heart strain and hypoxia [name])   PULMONARY THROMBECTOMY Bilateral 01/12/2021   Procedure: PULMONARY THROMBECTOMY;  Surgeon: Katha Cabal, MD;  Location: [place] [facility];  Service: Cardiovascular;  Laterality: Bilateral;   TONSILLECTOMY  7 yoa   US ECHOCARDIOGRAPHY  05/2015   mild concentric LVH, EF 66%, mildly dilated aortic root 4.1cm   Family History  Problem Relation Age of Onset   Benign prostatic hyperplasia Father  Diabetes Maternal Grandfather    Heart failure Maternal Grandfather    Parkinsonism Paternal Grandmother    Stroke Paternal Grandfather    Coronary artery disease Neg Hx    Cancer Neg Hx    Social History   Socioeconomic History    Marital status: Married    Spouse name: Not on file   Number of children: 2   Years of education: Not on file   Highest education level: Not on file  Occupational History   Occupation: Chief Financial Officer, [employer]    Employer: [employer]    Comment: [employer]  Tobacco Use   Smoking status: Never   Smokeless tobacco: Never  Substance and Sexual Activity   Alcohol use: Yes    Alcohol/week: 6.0 standard drinks    Types: 6 drink(s) per week    Comment: weekly   Drug use: No   Sexual activity: Yes  Other Topics Concern   Not on file  Social History Narrative   Caffeine: 4-5 cups coffee/day   Lives with wife and 2 daughters, 3 dogs   Occupation: Freight forwarder at [employer]   Activity: works out 6d/wk, cardio and [employer]   Diet: some water, fruits/vegetables daily, no fast food   Social Determinants of Radio broadcast assistant Strain: Not on file  Food Insecurity: Not on file  Transportation Needs: Not on file  Physical Activity: Not on file  Stress: Not on file  Social Connections: Not on file  Intimate Partner Violence: Not on file   No Known Allergies  Current Outpatient Medications on File Prior to Visit  Medication Sig Dispense Refill   apixaban (ELIQUIS) 5 MG TABS tablet Take 1 tablet (5 mg total) by mouth 2 (two) times daily. 60 tablet 4   cetirizine (ZYRTEC) 10 MG tablet Take 10 mg by mouth daily.     diphenhydrAMINE (BENADRYL) 25 mg capsule Take 50 mg by mouth at bedtime as needed.     APIXABAN (ELIQUIS) VTE STARTER PACK (10MG  AND 5MG ) Take as directed on package: start with two-5mg  tablets twice daily for 7 days. On day 8, switch to one-5mg  tablet twice daily. (Patient not taking: Reported on 02/09/2021) 1 each 0   No current facility-administered medications on file prior to visit.   Labs CBC    Component Value Date/Time   WBC 7.3 01/13/2021 0033   RBC 4.08 (L) 01/13/2021 0033   HGB 12.9 (L) 01/13/2021 0033   HCT 37.4 (L) 01/13/2021 0033   PLT 228  01/13/2021 0033   MCV 91.7 01/13/2021 0033   MCH 31.6 01/13/2021 0033   MCHC 34.5 01/13/2021 0033   RDW 12.5 01/13/2021 0033   LYMPHSABS 1.6 12/13/2020 0756   MONOABS 0.4 12/13/2020 0756   EOSABS 0.2 12/13/2020 0756   BASOSABS 0.1 12/13/2020 0756   Imaging: CT Angio 01/12/2021  IMPRESSION: Acute pulmonary embolism. Large embolic burden with enlargement the central pulmonary arteries in keeping with pulmonary arterial hypertension. No CT evidence of right heart strain.  2D ECHO on 01/12/2021 showed an EF of 60-65%.  Physical Exam Vitals and nursing note reviewed.  HENT:     Head: Normocephalic and atraumatic.     Nose: No congestion.     Mouth/Throat:     Mouth: Mucous membranes are moist.     Pharynx: Oropharynx is clear.  Eyes:     General:        Right eye: No discharge.        Left eye: No discharge.  Extraocular Movements: Extraocular movements intact.     Pupils: Pupils are equal, round, and reactive to light.  Cardiovascular:     Rate and Rhythm: Normal rate and regular rhythm.     Pulses: Normal pulses.     Heart sounds: No murmur heard. Pulmonary:     Effort: Pulmonary effort is normal.  Abdominal:     General: Bowel sounds are normal. There is no distension.     Tenderness: There is no abdominal tenderness. There is no guarding.  Musculoskeletal:        General: No swelling or deformity.     Right lower leg: No edema.     Left lower leg: No edema.     Comments: Patient is wearing compression stockings on BLL, but he is unsure of the compression grade.   Skin:    General: Skin is warm and dry.     Capillary Refill: Capillary refill takes less than 2 seconds.  Neurological:     Mental Status: He is alert and oriented to person, place, and time.  Psychiatric:        Mood and Affect: Mood normal.        Thought Content: Thought content normal.   Assessment & Plan:   Unprovoked PE/DVT No prior history of blood clots. Initial presentation with  shortness of breath and right lower extremity swelling on 01/11/2021. No provoking events such as immobilization, surgery or long trips. CT showed large clot burden and pulmonary hypertension.  No evidence of heart strain. He is status post thrombolysis and mechanical thrombectomy. He was discharged on Eliquis.  Plan:  Unprovoked PE/DVT: Given extensive clot in his lung with and provoking event would recommend thrombophilia work-up. Continue Eliquis. RTC in 2 weeks to review results.  Greater than 50% was spent in counseling and coordination of care with this patient including but not limited to discussion of the relevant topics above (See A&P) including, but not limited to diagnosis and management of acute and chronic medical conditions.   The patient's diagnosis, an outline of the further diagnostic and laboratory studies which will be required, the recommendation for surgery, and alternatives were discussed with her and her accompanying family members.  All questions were answered to their satisfaction.  I personally had a face to face interaction and evaluated the patient jointly with the NP Student, Mrs. Benedetto Goad.  I have reviewed her history and available records and have performed the key portions of the physical exam including general, HEENT, abdominal exam, pelvic exam with my findings confirming those documented above by the APP student.  I have discussed the case with the APP student and the patient.  I agree with the above documentation, assessment and plan which was fully formulated by me.  Counseling was completed by me.    Benedetto Goad, Student FNP  Faythe Casa, NP 02/10/2021 8:23 AM

## 2021-02-09 NOTE — Telephone Encounter (Signed)
Called pt and discussed results of CT abdomen with/without contrast done 02/08/2021 at [facility]:  L kidney rim calcified cyst 5x2x2cm most consistent with remote subcapsular hematoma, with elongated L kidney ?duplicated upper collecting system.  Sent report for STAT scanning

## 2021-02-09 NOTE — Progress Notes (Signed)
New patient evaluation.   

## 2021-02-10 LAB — LUPUS ANTICOAGULANT PANEL
DRVVT: 37.3 s (ref 0.0–47.0)
PTT Lupus Anticoagulant: 30.6 s (ref 0.0–51.9)

## 2021-02-10 LAB — PROTEIN S, TOTAL: Protein S Ag, Total: 118 % (ref 60–150)

## 2021-02-10 LAB — CARDIOLIPIN ANTIBODIES, IGG, IGM, IGA
Anticardiolipin IgA: <9 APL U/mL (ref 0–11)
Anticardiolipin IgG: 9 GPL U/mL (ref 0–14)
Anticardiolipin IgM: <9 MPL U/mL (ref 0–12)

## 2021-02-10 LAB — PROTEIN C ACTIVITY: Protein C Activity: 147 % (ref 73–180)

## 2021-02-10 LAB — PROTEIN S ACTIVITY: Protein S Activity: 114 % (ref 63–140)

## 2021-02-11 LAB — BETA-2-GLYCOPROTEIN I ABS, IGG/M/A
Beta-2 Glyco I IgG: <9 GPI IgG units (ref 0–20)
Beta-2-Glycoprotein I IgA: <9 GPI IgA units (ref 0–25)
Beta-2-Glycoprotein I IgM: <9 GPI IgM units (ref 0–32)

## 2021-02-11 LAB — PROTEIN C, TOTAL: Protein C, Total: 140 % (ref 60–150)

## 2021-02-14 LAB — FACTOR 5 LEIDEN

## 2021-02-16 LAB — PROTHROMBIN GENE MUTATION

## 2021-02-28 ENCOUNTER — Inpatient Hospital Stay: Payer: BC Managed Care – PPO | Admitting: Oncology

## 2021-03-05 ENCOUNTER — Other Ambulatory Visit: Payer: Self-pay | Admitting: Family Medicine

## 2021-03-07 NOTE — Telephone Encounter (Signed)
Last OV- 01/24/2021 Next OV - 12/20/2021 Last Filled - 01/24/2021

## 2021-03-08 ENCOUNTER — Inpatient Hospital Stay: Payer: BC Managed Care – PPO | Admitting: Oncology

## 2021-03-10 ENCOUNTER — Inpatient Hospital Stay: Payer: BC Managed Care – PPO | Attending: Oncology | Admitting: Oncology

## 2021-03-10 ENCOUNTER — Encounter: Payer: Self-pay | Admitting: Oncology

## 2021-03-10 DIAGNOSIS — I82431 Acute embolism and thrombosis of right popliteal vein: Secondary | ICD-10-CM | POA: Diagnosis not present

## 2021-03-10 DIAGNOSIS — I2699 Other pulmonary embolism without acute cor pulmonale: Secondary | ICD-10-CM | POA: Diagnosis not present

## 2021-03-10 NOTE — Progress Notes (Signed)
I connected with Alejandro Decker on 03/10/21 at  9:15 AM EDT by video enabled telemedicine visit and verified that I am speaking with the correct person using two identifiers.   I discussed the limitations, risks, security and privacy concerns of performing an evaluation and management service by telemedicine and the availability of in-person appointments. I also discussed with the patient that there may be a patient responsible charge related to this service. The patient expressed understanding and agreed to proceed.  Other persons participating in the visit and their role in the encounter:  none  Patient's location:  work Provider's location:  home  Chief Complaint:  discuss results of hypercoagulable bloodwork  History of present illness: Patient is a 60 year old male who presented with symptoms of exertional shortness of breath and right lower extremity swelling in June 2022.Doppler showed occlusive thrombus in the popliteal and calf veins as well as superficial nonocclusive thrombus in the saphenous vein from thigh to calf.  CT angio showed acute bilateral PE with large embolic burden and enlargement of central pulmonary arteries but no evidence of right heart strain.  Echocardiogram was normal.  He underwent thrombectomy for his PE and also started on Eliquis.  No preceding trauma or prolonged period of immobilization.  No family history of DVT or PE.  He has not had a colonoscopy yet.  He did undergo a CT abdomen at [facility] as CT chest showedPossible lesion in his right kidney which was found to be a subcapsular hematoma.  No evidence of malignancy noted on that scan  Patient was seen by NP Rulon Abide for a new patient recently underlying hypercoagulable work-up which did not show any evidence of factor V Leiden and prothrombin gene mutation.  protein C, protein C&S, Antithrombin 3 levels which were normal.  Antiphospholipid antibody syndrome labs were unremarkable.  Interval history  patient reports doing well presently and denies any specific complaints at this time.  He is tolerating Eliquis well without any significant side effects.  Reports no bleeding issues   Review of Systems  Constitutional:  Negative for chills, fever, malaise/fatigue and weight loss.  HENT:  Negative for congestion, ear discharge and nosebleeds.   Eyes:  Negative for blurred vision.  Respiratory:  Negative for cough, hemoptysis, sputum production, shortness of breath and wheezing.   Cardiovascular:  Negative for chest pain, palpitations, orthopnea and claudication.  Gastrointestinal:  Negative for abdominal pain, blood in stool, constipation, diarrhea, heartburn, melena, nausea and vomiting.  Genitourinary:  Negative for dysuria, flank pain, frequency, hematuria and urgency.  Musculoskeletal:  Negative for back pain, joint pain and myalgias.  Skin:  Negative for rash.  Neurological:  Negative for dizziness, tingling, focal weakness, seizures, weakness and headaches.  Endo/Heme/Allergies:  Does not bruise/bleed easily.  Psychiatric/Behavioral:  Negative for depression and suicidal ideas. The patient does not have insomnia.    No Known Allergies  Past Medical History:  Diagnosis Date   GERD (gastroesophageal reflux disease)    Hyperlipemia    statin caused bump in LFTs, saw cards, rec treatment if LDL >130   Incomplete RBBB 05/08/2015   Venous thromboembolism (VTE) 01/2021   RLE DVT + extensive bilateral PE (eliquis)    Past Surgical History:  Procedure Laterality Date   CARDIOVASCULAR STRESS TEST  05/2015   ETT - WNL, EF 58% (Dr Einar Gip)   CT CTA CORONARY W/CA SCORE W/CM &/OR WO/CM  05/2015   score of 6   LASIK  11/17/2009   Dr. Gaspar Bidding, [place],  [facility]   PULMONARY THROMBECTOMY Bilateral 01/2021   TPA thrombolysis and thrombectomy for bilateral PE with R heart strain and hypoxia [name])   PULMONARY THROMBECTOMY Bilateral 01/12/2021   Procedure: PULMONARY THROMBECTOMY;  Surgeon:  Katha Cabal, MD;  Location: [place] [facility];  Service: Cardiovascular;  Laterality: Bilateral;   TONSILLECTOMY  7 yoa   US ECHOCARDIOGRAPHY  05/2015   mild concentric LVH, EF 66%, mildly dilated aortic root 4.1cm    Social History   Socioeconomic History   Marital status: Married    Spouse name: Not on file   Number of children: 2   Years of education: Not on file   Highest education level: Not on file  Occupational History   Occupation: Chief Financial Officer, [employer]    Employer: [employer]    Comment: [employer] Engr[employer]  Tobacco Use   Smoking status: Never   Smokeless tobacco: Never  Vaping Use   Vaping Use: Not on file  Substance and Sexual Activity   Alcohol use: Yes    Alcohol/week: 6.0 standard drinks    Types: 6 drink(s) per week    Comment: weekly   Drug use: No   Sexual activity: Yes  Other Topics Concern   Not on file  Social History Narrative   Caffeine: 4-5 cups coffee/day   Lives with wife and 2 daughters, 3 dogs   Occupation: Freight forwarder at [employer]   Activity: works out 6d/wk, cardio and [employer]   Diet: some water, fruits/vegetables daily, no fast food   Social Determinants of Radio broadcast assistant Strain: Not on file  Food Insecurity: Not on file  Transportation Needs: Not on file  Physical Activity: Not on file  Stress: Not on file  Social Connections: Not on file  Intimate Partner Violence: Not on file    Family History  Problem Relation Age of Onset   Benign prostatic hyperplasia Father    Diabetes Maternal Grandfather    Heart failure Maternal Grandfather    Parkinsonism Paternal Grandmother    Stroke Paternal Grandfather    Coronary artery disease Neg Hx    Cancer Neg Hx      Current Outpatient Medications:    apixaban (ELIQUIS) 5 MG TABS tablet, Take 1 tablet (5 mg total) by mouth 2 (two) times daily., Disp: 60 tablet, Rfl: 4   cetirizine (ZYRTEC) 10 MG tablet, Take 10 mg by mouth daily as needed., Disp: , Rfl:     diphenhydrAMINE (BENADRYL) 25 mg capsule, Take 50 mg by mouth at bedtime as needed., Disp: , Rfl:   No results found.  No images are attached to the encounter.   CMP Latest Ref Rng & Units 01/12/2021  Glucose 70 - 99 mg/dL 90  BUN 6 - 20 mg/dL 20  Creatinine 0.61 - 1.24 mg/dL 1.17  Sodium 135 - 145 mmol/L 136  Potassium 3.5 - 5.1 mmol/L 3.8  Chloride 98 - 111 mmol/L 106  CO2 22 - 32 mmol/L 23  Calcium 8.9 - 10.3 mg/dL 8.2(L)  Total Protein 6.0 - 8.3 g/dL -  Total Bilirubin 0.2 - 1.2 mg/dL -  Alkaline Phos 39 - 117 U/L -  AST 0 - 37 U/L -  ALT 0 - 53 U/L -   CBC Latest Ref Rng & Units 01/13/2021  WBC 4.0 - 10.5 K/uL 7.3  Hemoglobin 13.0 - 17.0 g/dL 12.9(L)  Hematocrit 39.0 - 52.0 % 37.4(L)  Platelets 150 - 400 K/uL 228     Observation/objective: Appears in  no acute distress over video visit today.  Breathing is nonlabored  Assessment and plan: Patient is a 60 year old male with unprovoked bilateral PE and right lower extremity DVT on Eliquis here to discuss results of hypercoagulable work-up  Discussed the results of hypercoagulable work-up which were essentially negative.  This massive PE as well as right lower extremity DVT appears to be an unprovoked event.  Given the nature of thrombosis, male sex, patient being overweight are all potential risk factors for recurrent thrombosis and I would favor indefinite anticoagulation for him while periodically weighing risks versus benefits of clotting versus bleeding.  Based on his history his bleeding risk appears to be low.  Patient can continue to follow-up with Dr. Danise Mina and remain on Eliquis.  He can be referred to [place] in the future if questions or concerns arise.  CT chest abdomen pelvis with contrast did not show any evidence of malignancy.  However patient needs to be up-to-date with his colonoscopy but that will have to wait until about 6 months following his episode of thrombosis and can be looked into early part of next  year  Follow-up instructions: No follow-up needed  I discussed the assessment and treatment plan with the patient. The patient was provided an opportunity to ask questions and all were answered. The patient agreed with the plan and demonstrated an understanding of the instructions.   The patient was advised to call back or seek an in-person evaluation if the symptoms worsen or if the condition fails to improve as anticipated. Visit Diagnosis: 1. Acute deep vein thrombosis (DVT) of popliteal vein of right lower extremity (HCC)   2. Acute pulmonary embolism without acute cor pulmonale, unspecified pulmonary embolism type (HCC)     Dr. Randa Evens, MD, MPH [facility] at [facility] Tel- [phone] 03/10/2021 7:03 PM

## 2021-03-22 ENCOUNTER — Ambulatory Visit: Payer: BC Managed Care – PPO

## 2021-04-07 ENCOUNTER — Ambulatory Visit: Payer: BC Managed Care – PPO

## 2021-04-14 ENCOUNTER — Other Ambulatory Visit: Payer: Self-pay

## 2021-04-14 ENCOUNTER — Ambulatory Visit (INDEPENDENT_AMBULATORY_CARE_PROVIDER_SITE_OTHER): Payer: BC Managed Care – PPO

## 2021-04-14 DIAGNOSIS — Z23 Encounter for immunization: Secondary | ICD-10-CM | POA: Diagnosis not present

## 2021-06-28 ENCOUNTER — Ambulatory Visit: Payer: BC Managed Care – PPO

## 2021-08-02 ENCOUNTER — Other Ambulatory Visit: Payer: Self-pay | Admitting: Family Medicine

## 2021-08-02 DIAGNOSIS — I2699 Other pulmonary embolism without acute cor pulmonale: Secondary | ICD-10-CM

## 2021-08-03 NOTE — Telephone Encounter (Signed)
Eliquis Last filled:  07/08/21, #60 Last OV:  01/24/21, hosp f/u Next OV:  12/27/21, CPE

## 2021-08-17 ENCOUNTER — Ambulatory Visit: Payer: BC Managed Care – PPO | Admitting: Family Medicine

## 2021-08-17 ENCOUNTER — Encounter: Payer: Self-pay | Admitting: Family Medicine

## 2021-08-17 ENCOUNTER — Other Ambulatory Visit: Payer: Self-pay

## 2021-08-17 ENCOUNTER — Ambulatory Visit (INDEPENDENT_AMBULATORY_CARE_PROVIDER_SITE_OTHER)
Admission: RE | Admit: 2021-08-17 | Discharge: 2021-08-17 | Disposition: A | Discharge status: Home / Self Care | Payer: BC Managed Care – PPO | Source: Ambulatory Visit | Attending: Family Medicine | Admitting: Family Medicine

## 2021-08-17 VITALS — BP 120/74 | HR 65 | Temp 98.3°F | Ht 70.0 in | Wt 252.1 lb

## 2021-08-17 DIAGNOSIS — M5030 Other cervical disc degeneration, unspecified cervical region: Secondary | ICD-10-CM | POA: Diagnosis not present

## 2021-08-17 DIAGNOSIS — G8929 Other chronic pain: Secondary | ICD-10-CM | POA: Diagnosis not present

## 2021-08-17 DIAGNOSIS — M542 Cervicalgia: Secondary | ICD-10-CM

## 2021-08-17 DIAGNOSIS — G5622 Lesion of ulnar nerve, left upper limb: Secondary | ICD-10-CM | POA: Diagnosis not present

## 2021-08-17 MED ORDER — TIZANIDINE HCL 4 MG PO TABS: 4.0000 mg | ORAL_TABLET | Freq: Every evening | ORAL | 2 refills | Status: DC | PRN

## 2021-08-17 MED ORDER — PREDNISONE 20 MG PO TABS: ORAL_TABLET | ORAL | 0 refills | Status: DC

## 2021-08-17 NOTE — Progress Notes (Signed)
Reata Petrov T. Carrel Leather, MD, [place] at [facility] [place], [address]  Phone: [phone]   FAX: [place] - 61 y.o. male   MRN [id number]   Date of Birth: 30-Jun-1961  Date: 08/17/2021   PCP: Ria Bush, MD   Referral: Ria Bush, MD  Chief Complaint  Patient presents with   Neck Pain    Lower left side-Constant   Numbness    Left Arm    This visit occurred during the SARS-CoV-2 public health emergency.  Safety protocols were in place, including screening questions prior to the visit, additional usage of staff PPE, and extensive cleaning of exam room while observing appropriate contact time as indicated for disinfecting solutions.   Subjective:   Alejandro Decker is a 61 y.o. very pleasant male patient with Body mass index is 36.18 kg/m. who presents with the following:  New onset neck pain on the left side.   Soreness on the lower backside of his neck.  Has been there for more than a year.  Tightness will come and go.  When in the bike and leaning forward.  He also has some intermittent numbness in the ulnar distribution.  This is also noted with bending the elbow some at nighttime and positional changes.  No major prior trauma of the neck or elbow.  No prior operations whatsoever.  He is functional, lifting weights, but he is having posterior pain most of the time that is impairing.  He is off Eliquis right now for PE.  Review of Systems is noted in the HPI, as appropriate  Objective:   BP 120/74    Pulse 65    Temp 98.3 F (36.8 C) (Temporal)    Ht 5\' 10"  (1.778 m)    Wt 252 lb 2 oz (114.4 kg)    SpO2 96%    BMI 36.18 kg/m   GEN: No acute distress; alert,appropriate. PULM: Breathing comfortably in no respiratory distress PSYCH: Normally interactive.    GEN: alert,appropriate PSYCH: Normally interactive. Cooperative during the interview.   CERVICAL SPINE EXAM Range of  motion: Flexion, extension, lateral bending, and rotation: Grossly 10% decreased motion compared to what would be expected. Pain with terminal motion: Modest. Spinous Processes: NT SCM: NT Upper paracervical muscles: Mild, bilateral, left greater than right Upper traps: NT C5-T1 intact, sensation and motor    Shoulder: Bilateral Inspection: No muscle wasting or winging Ecchymosis/edema: neg  AC joint, scapula, clavicle: NT Cervical spine: NT, full ROM Spurling's: neg Abduction: full, 5/5 Flexion: full, 5/5 IR, full, lift-off: 5/5 ER at neutral: full, 5/5 AC crossover and compression: neg Neer: neg Hawkins: neg Drop Test: neg Empty Can: neg Supraspinatus insertion: NT Bicipital groove: NT Speed's: neg Yergason's: neg Sulcus sign: neg Scapular dyskinesis: none   Laboratory and Imaging Data: DG Cervical Spine Complete  Result Date: 08/17/2021 CLINICAL DATA:  Chronic neck pain.  Left radicular symptoms. EXAM: CERVICAL SPINE - COMPLETE 4+ VIEW COMPARISON:  None. FINDINGS: Straightening of normal lordosis. Grade 1 anterolisthesis of C5 on C6 and C6 on C7 of 2 mm. Disc space narrowing and endplate spurring V7-O1, C6-C7, and C7-T1, with endplate spurring at Y0-V3, preservation of C4-C5 disc space. There is mild multilevel facet hypertrophy. Bony neural foraminal narrowing at C6-C7 and C7-T1 on the left. There is no evidence of fracture, focal bone lesion or bone destruction. No prevertebral soft tissue thickening. IMPRESSION: 1. Degenerative disc disease from C5-C6 through  C7-T1. 2. Mild multilevel facet hypertrophy. Mild C5-C6 and C6-C7 anterolisthesis. 3. Left C6-C7 and C7-T1 bony neural foraminal narrowing. Electronically Signed   By: Keith Rake M.D.   On: 08/17/2021 21:24     Assessment and Plan:     ICD-10-CM   1. DDD (degenerative disc disease), cervical  M50.30 predniSONE (DELTASONE) 20 MG tablet    tiZANidine (ZANAFLEX) 4 MG tablet    Ambulatory referral to Physical  Therapy    2. Chronic neck pain  M54.2 DG Cervical Spine Complete   G89.29 predniSONE (DELTASONE) 20 MG tablet    tiZANidine (ZANAFLEX) 4 MG tablet    Ambulatory referral to Physical Therapy    3. 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  We reviewed all this face-to-face.  This is going to be a challenge, and I think that this is going to flareup off and on indefinitely.  He is already quite physically active, which is helpful.  I am also going to have him do a few physical therapy visits to help incorporate some specific neck work into his workouts.  Pulse of steroids to see if this will calm him down acutely.  Zanaflex at night when he is having more symptoms is also entirely reasonable.  Meds ordered this encounter  Medications   predniSONE (DELTASONE) 20 MG tablet    Sig: 2 tabs po daily for 5 days, then 1 tab po daily for 5 days    Dispense:  15 tablet    Refill:  0   tiZANidine (ZANAFLEX) 4 MG tablet    Sig: Take 1 tablet (4 mg total) by mouth at bedtime as needed for muscle spasms.    Dispense:  30 tablet    Refill:  2   There are no discontinued medications. Orders Placed This Encounter  Procedures   DG Cervical Spine Complete   Ambulatory referral to Physical Therapy    Follow-up: No follow-ups on file.  [employer] speech-to-text software was used for transcription in this dictation.  Possible transcriptional errors can occur using Editor, commissioning.   Signed,  Maud Deed. Fraser Busche, MD   Outpatient Encounter Medications as of 08/17/2021  Medication Sig   apixaban (ELIQUIS) 5 MG TABS tablet TAKE 1 TABLET BY MOUTH TWICE A DAY   cetirizine (ZYRTEC) 10 MG tablet Take 10 mg by mouth daily as needed.   diphenhydrAMINE (BENADRYL) 25 mg capsule Take 50 mg by mouth at bedtime as needed.   predniSONE (DELTASONE) 20 MG tablet 2 tabs po  daily for 5 days, then 1 tab po daily for 5 days   tiZANidine (ZANAFLEX) 4 MG tablet Take 1 tablet (4 mg total) by mouth at bedtime as needed for muscle spasms.   No facility-administered encounter medications on file as of 08/17/2021.

## 2021-08-17 NOTE — Patient Instructions (Signed)
Ulnar neuropathy brace

## 2021-08-18 DIAGNOSIS — M5030 Other cervical disc degeneration, unspecified cervical region: Secondary | ICD-10-CM | POA: Insufficient documentation

## 2021-08-20 NOTE — Progress Notes (Signed)
MRN : [id number]  Alejandro Decker is a 61 y.o. (06-12-1961) male who presents with chief complaint of check clots.  History of Present Illness:   The patient presents to the office for follow-up evaluation status post pulmonary thrombectomy.  Thrombectomy was performed at at [facility] on 01/12/2021.    Procedure: Mechanical thrombectomy bilateral pulmonary emboli using penumbra CAT 8 device with lightening technology  The presenting symptoms were pleuritic chest pain and profound SOB.  CT angiogram demonstrated a large volume of emboli with significant right heart strain.  The patient notes resolution of his shortness of breath.  He denies pleuritic chest pain.  He denies persisting cough or hemoptysis.  The patient has not been using compression therapy at this point.  The patient denies significant leg pain and swelling dependency.  The patient notes minimal edema in the morning.    No blood per rectum or blood in any sputum.  No excessive bruising per the patient.    No outpatient medications have been marked as taking for the 08/22/21 encounter (Appointment) with Delana Meyer, Dolores Lory, MD.    Past Medical History:  Diagnosis Date   GERD (gastroesophageal reflux disease)    Hyperlipemia    statin caused bump in LFTs, saw cards, rec treatment if LDL >130   Incomplete RBBB 05/08/2015   Venous thromboembolism (VTE) 01/2021   RLE DVT + extensive bilateral PE (eliquis)    Past Surgical History:  Procedure Laterality Date   CARDIOVASCULAR STRESS TEST  05/2015   ETT - WNL, EF 58% (Dr Einar Gip)   CT CTA CORONARY W/CA SCORE W/CM &/OR WO/CM  05/2015   score of 6   LASIK  11/17/2009   Dr. Gaspar Bidding, [place], [facility]   PULMONARY THROMBECTOMY Bilateral 01/2021   TPA thrombolysis and thrombectomy for bilateral PE with R heart strain and hypoxia [name])   PULMONARY THROMBECTOMY Bilateral 01/12/2021   Procedure: PULMONARY THROMBECTOMY;  Surgeon: Katha Cabal, MD;  Location: [facility]  CV LAB;  Service: Cardiovascular;  Laterality: Bilateral;   TONSILLECTOMY  7 yoa   US ECHOCARDIOGRAPHY  05/2015   mild concentric LVH, EF 66%, mildly dilated aortic root 4.1cm    Social History Social History   Tobacco Use   Smoking status: Never   Smokeless tobacco: Never  Substance Use Topics   Alcohol use: Yes    Alcohol/week: 6.0 standard drinks    Types: 6 drink(s) per week    Comment: weekly   Drug use: No    Family History Family History  Problem Relation Age of Onset   Benign prostatic hyperplasia Father    Diabetes Maternal Grandfather    Heart failure Maternal Grandfather    Parkinsonism Paternal Grandmother    Stroke Paternal Grandfather    Coronary artery disease Neg Hx    Cancer Neg Hx     No Known Allergies   REVIEW OF SYSTEMS (Negative unless checked)  Constitutional: [] Weight loss  [] Fever  [] Chills Cardiac: [] Chest pain   [] Chest pressure   [] Palpitations   [] Shortness of breath when laying flat   [] Shortness of breath with exertion. Vascular:  [] Pain in legs with walking   [] Pain in legs at rest  [x] History of DVT   [] Phlebitis   [x] Swelling in legs   [] Varicose veins   [] Non-healing ulcers Pulmonary:   [] Uses home oxygen   [] Productive cough   [] Hemoptysis   [] Wheeze  [] COPD   [] Asthma Neurologic:  [] Dizziness   [] Seizures   [] History of stroke   []   History of TIA  [] Aphasia   [] Vissual changes   [] Weakness or numbness in arm   [] Weakness or numbness in leg Musculoskeletal:   [] Joint swelling   [] Joint pain   [] Low back pain Hematologic:  [] Easy bruising  [] Easy bleeding   [] Hypercoagulable state   [] Anemic Gastrointestinal:  [] Diarrhea   [] Vomiting  [x] Gastroesophageal reflux/heartburn   [] Difficulty swallowing. Genitourinary:  [] Chronic kidney disease   [] Difficult urination  [] Frequent urination   [] Blood in urine Skin:  [] Rashes   [] Ulcers  Psychological:  [] History of anxiety   []  History of major depression.  Physical Examination  There were  no vitals filed for this visit. There is no height or weight on file to calculate BMI. Gen: WD/WN, NAD Head: [place]/AT, No temporalis wasting.  Ear/Nose/Throat: Hearing grossly intact, nares w/o erythema or drainage, pinna without lesions Eyes: PER, EOMI, sclera nonicteric.  Neck: Supple, no gross masses.  No JVD.  Pulmonary:  Good air movement, no audible wheezing, no use of accessory muscles.  Cardiac: RRR, precordium not hyperdynamic. Vascular:  scattered varicosities present bilaterally.  Mild venous stasis changes to the legs bilaterally.  Trace soft pitting edema  Vessel Right Left  Radial Palpable Palpable  Gastrointestinal: soft, non-distended. No guarding/no peritoneal signs.  Musculoskeletal: M/S 5/5 throughout.  No deformity.  Neurologic: CN 2-12 intact. Pain and light touch intact in extremities.  Symmetrical.  Speech is fluent. Motor exam as listed above. Psychiatric: Judgment intact, Mood & affect appropriate for pt's clinical situation. Dermatologic: Venous rashes no ulcers noted.  No changes consistent with cellulitis. Lymph : No lichenification or skin changes of chronic lymphedema.  CBC Lab Results  Component Value Date   WBC 7.3 01/13/2021   HGB 12.9 (L) 01/13/2021   HCT 37.4 (L) 01/13/2021   MCV 91.7 01/13/2021   PLT 228 01/13/2021    BMET    Component Value Date/Time   NA 136 01/12/2021 0251   K 3.8 01/12/2021 0251   CL 106 01/12/2021 0251   CO2 23 01/12/2021 0251   GLUCOSE 90 01/12/2021 0251   BUN 20 01/12/2021 0251   CREATININE 1.17 01/12/2021 0251   CALCIUM 8.2 (L) 01/12/2021 0251   GFRNONAA >60 01/12/2021 0251   GFRAA 70 12/12/2007 0921   CrCl cannot be calculated (Patient's most recent lab result is older than the maximum 21 days allowed.).  COAG Lab Results  Component Value Date   INR 0.9 01/12/2021    Radiology DG Cervical Spine Complete  Result Date: 08/17/2021 CLINICAL DATA:  Chronic neck pain.  Left radicular symptoms. EXAM: CERVICAL  SPINE - COMPLETE 4+ VIEW COMPARISON:  None. FINDINGS: Straightening of normal lordosis. Grade 1 anterolisthesis of C5 on C6 and C6 on C7 of 2 mm. Disc space narrowing and endplate spurring W5-I6, C6-C7, and C7-T1, with endplate spurring at E7-O3, preservation of C4-C5 disc space. There is mild multilevel facet hypertrophy. Bony neural foraminal narrowing at C6-C7 and C7-T1 on the left. There is no evidence of fracture, focal bone lesion or bone destruction. No prevertebral soft tissue thickening. IMPRESSION: 1. Degenerative disc disease from C5-C6 through C7-T1. 2. Mild multilevel facet hypertrophy. Mild C5-C6 and C6-C7 anterolisthesis. 3. Left C6-C7 and C7-T1 bony neural foraminal narrowing. Electronically Signed   By: Keith Rake M.D.   On: 08/17/2021 21:24     Assessment/Plan 1. History of pulmonary embolism Recommend:   No surgery or intervention at this point in time.  IVC filter is not indicated at present.  Patient's duplex ultrasound of  the venous system shows resolution of the DVT in the right lower extremity.  There are no chronic changes noted.  Essentially his ultrasound has normalized since his last visit.  The patient is initiated on anticoagulation and he follows with hematology.  They recommended lifelong anticoagulation giving the unprovoked nature of his clot  Elevation was stressed, use of a recliner was discussed.  I recommended to the patient regarding DVT and post phlebitic changes such as swelling and why it  causes symptoms such as pain.  The patient will wear graduated compression stockings on a daily basis a prescription was given. The patient will  beginning wearing the stockings first thing in the morning and removing them in the evening. The patient is instructed specifically not to sleep in the stockings.  In addition, behavioral modification including elevation during the day and avoidance of prolonged dependency will be initiated.    The patient will continue  anticoagulation for now as there have not been any problems or complications at this point.    2. Venous thromboembolism (VTE) See #1  3. Hyperlipidemia, unspecified hyperlipidemia type Continue statin as ordered and reviewed, no changes at this time   4. DDD (degenerative disc disease), cervical Continue arthritis medications as already ordered, these medications have been reviewed and there are no changes at this time.  Continued activity and therapy was stressed.     Hortencia Pilar, MD  08/20/2021 4:38 PM

## 2021-08-22 ENCOUNTER — Ambulatory Visit (INDEPENDENT_AMBULATORY_CARE_PROVIDER_SITE_OTHER): Payer: BC Managed Care – PPO

## 2021-08-22 ENCOUNTER — Encounter (INDEPENDENT_AMBULATORY_CARE_PROVIDER_SITE_OTHER): Payer: Self-pay | Admitting: Vascular Surgery

## 2021-08-22 ENCOUNTER — Ambulatory Visit (INDEPENDENT_AMBULATORY_CARE_PROVIDER_SITE_OTHER): Payer: BC Managed Care – PPO | Admitting: Vascular Surgery

## 2021-08-22 ENCOUNTER — Other Ambulatory Visit: Payer: Self-pay

## 2021-08-22 VITALS — BP 150/90 | HR 63 | Resp 16 | Wt 251.4 lb

## 2021-08-22 DIAGNOSIS — I8290 Acute embolism and thrombosis of unspecified vein: Secondary | ICD-10-CM

## 2021-08-22 DIAGNOSIS — M5030 Other cervical disc degeneration, unspecified cervical region: Secondary | ICD-10-CM

## 2021-08-22 DIAGNOSIS — Z86711 Personal history of pulmonary embolism: Secondary | ICD-10-CM

## 2021-08-22 DIAGNOSIS — E785 Hyperlipidemia, unspecified: Secondary | ICD-10-CM

## 2021-08-22 DIAGNOSIS — I82431 Acute embolism and thrombosis of right popliteal vein: Secondary | ICD-10-CM

## 2021-09-01 ENCOUNTER — Other Ambulatory Visit: Payer: Self-pay

## 2021-09-01 ENCOUNTER — Ambulatory Visit: Payer: BC Managed Care – PPO | Attending: Family Medicine

## 2021-09-01 DIAGNOSIS — M5030 Other cervical disc degeneration, unspecified cervical region: Secondary | ICD-10-CM | POA: Diagnosis not present

## 2021-09-01 DIAGNOSIS — M542 Cervicalgia: Secondary | ICD-10-CM | POA: Diagnosis not present

## 2021-09-01 DIAGNOSIS — G8929 Other chronic pain: Secondary | ICD-10-CM | POA: Diagnosis not present

## 2021-09-01 DIAGNOSIS — R293 Abnormal posture: Secondary | ICD-10-CM | POA: Diagnosis not present

## 2021-09-01 NOTE — Therapy (Signed)
[facility] [address] [place], [address] Phone: [phone]   Fax:  [phone]  Physical Therapy Evaluation  Patient Details  Name: Alejandro Decker MRN: [id number] Date of Birth: [birth year] Referring Provider (PT): Owens Loffler MD (sports medicine)   Encounter Date: 09/01/2021   PT End of Session - 09/01/21 1539     Visit Number 1    Number of Visits 12    Date for PT Re-Evaluation 10/13/21    Authorization Type [employer]    Authorization Time Period 09/01/21-10/13/21    Progress Note Due on Visit 10    PT Start Time 1430    PT Stop Time 1522    PT Time Calculation (min) 52 min    Activity Tolerance Patient tolerated treatment well;No increased pain    Behavior During Therapy WFL for tasks assessed/performed             Past Medical History:  Diagnosis Date   GERD (gastroesophageal reflux disease)    Hyperlipemia    statin caused bump in LFTs, saw cards, rec treatment if LDL >130   Incomplete RBBB 05/08/2015   Venous thromboembolism (VTE) 01/2021   RLE DVT + extensive bilateral PE (eliquis)    Past Surgical History:  Procedure Laterality Date   CARDIOVASCULAR STRESS TEST  05/2015   ETT - WNL, EF 58% (Dr Einar Gip)   CT CTA CORONARY W/CA SCORE W/CM &/OR WO/CM  05/2015   score of 6   LASIK  11/17/2009   Dr. Gaspar Bidding, [place], [facility]   PULMONARY THROMBECTOMY Bilateral 01/2021   TPA thrombolysis and thrombectomy for bilateral PE with R heart strain and hypoxia [name])   PULMONARY THROMBECTOMY Bilateral 01/12/2021   Procedure: PULMONARY THROMBECTOMY;  Surgeon: Katha Cabal, MD;  Location: [facility];  Service: Cardiovascular;  Laterality: Bilateral;   TONSILLECTOMY  7 yoa   US ECHOCARDIOGRAPHY  05/2015   mild concentric LVH, EF 66%, mildly dilated aortic root 4.1cm    There were no vitals filed for this visit.    Subjective Assessment - 09/01/21 1523      Subjective Pt present to OPPT for ~1 year of nagging neck soreness and transient left ulnar paresthesias.    Pertinent History Aidric Endicott is a 4yoM referred to OPPT for evaluation of Left neck pain x1 year, persistent low-level soreness near the Left upper trap area. Pt seen by sports medicine, noted to have imaging showing DDD, DJD of cervical spine. Also evaluated for transient left ulnar distribution paresthesia noted when on his road bike or on spin bike at gym. Pt denies any specific actiivty limitations but does reports a 25+ year history of cycling without neck pain. Pt is also active in gym for resistance training as well, notes neck sorenss but not made worse by or limiting to his workouts.    How long can you sit comfortably? unlimited    How long can you stand comfortably? unlimited    How long can you walk comfortably? unlimited    Diagnostic tests Cervical spine scan report revealing of "Bony neural foraminal narrowing at C6-C7 and C7-T1 on the left."    Patient Stated Goals Resolve neck soreness, be able to cycle without left arm paresthesia, be able to sleeo without left ulnar parestheisa    Currently in Pain? Yes    Pain Score 1    Left lateral lower neck soreness near upper trapezius area   Aggravating  Factors  left head turns when driving, cycling posture with cervical extension    Pain Relieving Factors Did have relief from prednisone therapy, still reports help from muscle relaxers.    Effect of Pain on Daily Activities Minimal                [facility] PT Assessment - 09/01/21 0001       Assessment   Medical Diagnosis Left neck pain, Left ulnar paresthesia    Referring Provider (PT) Owens Loffler MD (sports medicine)    Onset Date/Surgical Date --   ~1 year ago   Prior Therapy none      Precautions   Precautions None      Balance Screen   Has the patient fallen in the past 6 months No    Has the patient had a decrease in activity level because of a fear of  falling?  No    Is the patient reluctant to leave their home because of a fear of falling?  No      Prior Function   Level of Independence Independent    Vocation Full time employment   [employer] training, road cycling, spin bike      Cognition   Overall Cognitive Status Within Functional Limits for tasks assessed      Observation/Other Assessments   Observations above average muscle bulk    Focus on Therapeutic Outcomes (FOTO)  65      Sensation   Light Touch Appears Intact      Posture/Postural Control   Posture Comments mild rigid thoracic kyphosis      ROM / Strength   AROM / PROM / Strength AROM      AROM   AROM Assessment Site Cervical;Thoracic    Cervical Extension 38    symptomatic soreness   Cervical - Right Side Bend 23     Cervical - Left Side Bend 23    symptomatic soreness   Cervical - Right Rotation 53 no symptoms    Capital rotation ~35 degrees with symptoms provocation   Cervical - Left Rotation 53 symptomatic soreness    Capital rotation ~35 degrees (no symptoms provocation)   Thoracic - Right Rotation --   deferred to next visit   Thoracic - Left Rotation --   deferred to next visit     Palpation   Spinal mobility PAIVM testing   Left C7/T1 facet WNL, Left C6/7 facet novel soreness, T1-T8 moderate hypermobility   Palpation comment novel soreness with palpation of triggerpoints in Left upper traps, left levator scapulae      Special Tests    Special Tests Cervical;Thoracic Outlet Syndrome    Cervical Tests Spurling's;other    Thoracic Outlet Syndrome  --    Reproduction of resting Lt neck pain c palpation and mobilization of Left 1st rib (moreso than scalenes stretch/releaes)     Spurling's   Findings Negative    Side Left    Comment Negative Spurling's A and B      other    Findings Negative    Side --   bilateral   Comment Median nerve and ulnar nerve tension test            -perpendicular towel roll at T6: BUE wand flexion 90  to end range in wide grip 1x20x3secH for HEP education *subjectively good stretch of thoracic spine, lats not showing significant limitations, nor are pecs at this visit  Objective measurements completed on examination: See  above findings.         PT Education - 09/01/21 1539     Education Details regional interdependence between T and C spine curvatures, as well as T spine and suboccipital shortening    Person(s) Educated Patient    Methods Explanation;Demonstration    Comprehension Verbalized understanding              PT Short Term Goals - 09/01/21 1705       PT SHORT TERM GOAL #1   Title After 3 weeks pt to demonstrate independent HEP performance for routine thoracic and cervical spine moblity work.    Time 3    Period Weeks    Status New    Target Date 09/22/21      PT SHORT TERM GOAL #2   Title Pt to demonstrate improved cervical rotation ROM to 70 degrees bilat, lateral flexion to 28 degrees bilat.    Time 3    Period Weeks    Status New    Target Date 09/22/21               PT Long Term Goals - 09/01/21 1708       PT LONG TERM GOAL #1   Title Pt to improve FOTO score to >75 to imply improved subjective ease in basic head/neck movements for ADL/IADL.    Time 6    Period Weeks    Status New    Target Date 10/13/21      PT LONG TERM GOAL #2   Title Pt to report ability to perform 60 minutes of cycling or spin bike activity without arm paresthesias.    Baseline Symptoms within 15 minutes    Time 6    Period Weeks    Status New    Target Date 10/13/21      PT LONG TERM GOAL #3   Title Pt will reports no waking paresthesias in Left ulnar distribution for preceding 2 weeks.    Time 6    Period Weeks    Status New    Target Date 10/13/21                    Plan - 09/01/21 1540     Clinical Impression Statement First part of exam takes place this visit- findings include general but symmetrical cervical spine hypomobility, increaesd  mid neck demand mechanically imposed by rigid thoracic kyphosis and adaptive shortening of suboccipital region, negative median and ulnar neural tension tests bilat, negative suprlings test. Palpation best reproduces symptomatic sorenss with pressure and moblization of left 1st rib. Mobilization of rib is followed by improved subjective moblity in head/neck. Educated patient on AA/ROM mobilization of the thoracic spine into extension. Pt will benefit from skilled PT intervention to improve pain, resolve paresthesias, and allow for full return to sleeping, driving and exercise without symptoms.    Personal Factors and Comorbidities Age;Behavior Pattern;Fitness    Examination-Activity Limitations Sleep;Other   fitness/cycling   Stability/Clinical Decision Making Stable/Uncomplicated    Clinical Decision Making Moderate    Rehab Potential Excellent    PT Frequency 2x / week    PT Duration 6 weeks    PT Treatment/Interventions ADLs/Self Care Home Management;Cryotherapy;Electrical Stimulation;Moist Heat;Neuromuscular re-education;Therapeutic exercise;Therapeutic activities;Functional mobility training;Patient/family education;Dry needling;Passive range of motion;Spinal Manipulations    PT Next Visit Plan Review HEP, repeat 1st rib mobilization    PT Home Exercise Plan towel roll dislocates x20 QD    Consulted and Agree with Plan of Care  Patient             Patient will benefit from skilled therapeutic intervention in order to improve the following deficits and impairments:  Postural dysfunction, Improper body mechanics, Impaired flexibility, Hypomobility, Decreased range of motion  Visit Diagnosis: Cervicalgia  Abnormal posture     Problem List Patient Active Problem List   Diagnosis Date Noted   DDD (degenerative disc disease), cervical 08/18/2021   Acute deep vein thrombosis (DVT) of popliteal vein of right lower extremity ([place]) 01/25/2021   Lesion of left native kidney 01/25/2021    History of pulmonary embolism 01/12/2021   Venous thromboembolism (VTE) 01/2021   Renal insufficiency 10/15/2019   Lower back pain 10/15/2019   Porokeratosis 10/11/2018   Severe obesity (BMI 35.0-39.9) with comorbidity ([place]) 09/07/2014   Dyspnea on exertion 04/02/2014   Chronic foot pain 12/16/2013   Skin rash 09/17/2013   Healthcare maintenance 03/14/2012   HLD (hyperlipidemia) 12/20/2007   Allergic rhinitis 11/27/2006   5:21 PM, 09/01/21 Etta Grandchild, PT, DPT Physical Therapist - [place] [phone] (Office)   Lodi C, PT 09/01/2021, 5:19 PM  [facility] [address] [place], [address] Phone: [phone]   Fax:  [phone]  Name: Alejandro Decker MRN: [id number] Date of Birth: 09-07-1960

## 2021-09-05 ENCOUNTER — Ambulatory Visit: Payer: BC Managed Care – PPO

## 2021-09-05 ENCOUNTER — Other Ambulatory Visit: Payer: Self-pay

## 2021-09-05 DIAGNOSIS — M5030 Other cervical disc degeneration, unspecified cervical region: Secondary | ICD-10-CM | POA: Diagnosis not present

## 2021-09-05 DIAGNOSIS — R293 Abnormal posture: Secondary | ICD-10-CM

## 2021-09-05 DIAGNOSIS — M542 Cervicalgia: Secondary | ICD-10-CM

## 2021-09-05 DIAGNOSIS — G8929 Other chronic pain: Secondary | ICD-10-CM | POA: Diagnosis not present

## 2021-09-05 NOTE — Therapy (Signed)
[facility] [address], [place], [address] Phone: [phone]   Fax:  [phone]  Physical Therapy Treatment  Patient Details  Name: Alejandro Decker MRN: [id number] Date of Birth: 1960/11/13 Referring Provider (PT): Owens Loffler MD (sports medicine)   Encounter Date: 09/05/2021   PT End of Session - 09/05/21 1611     Visit Number 2    Number of Visits 12    Date for PT Re-Evaluation 10/13/21    Authorization Type [employer] Comm Pro    Authorization Time Period 09/01/21-10/13/21    Progress Note Due on Visit 10             Past Medical History:  Diagnosis Date   GERD (gastroesophageal reflux disease)    Hyperlipemia    statin caused bump in LFTs, saw cards, rec treatment if LDL >130   Incomplete RBBB 05/08/2015   Venous thromboembolism (VTE) 01/2021   RLE DVT + extensive bilateral PE (eliquis)    Past Surgical History:  Procedure Laterality Date   CARDIOVASCULAR STRESS TEST  05/2015   ETT - WNL, EF 58% (Dr Einar Gip)   CT CTA CORONARY W/CA SCORE W/CM &/OR WO/CM  05/2015   score of 6   LASIK  11/17/2009   Dr. Gaspar Bidding, [place], [facility]   PULMONARY THROMBECTOMY Bilateral 01/2021   TPA thrombolysis and thrombectomy for bilateral PE with R heart strain and hypoxia Alejandro Decker)   PULMONARY THROMBECTOMY Bilateral 01/12/2021   Procedure: PULMONARY THROMBECTOMY;  Surgeon: Katha Cabal, MD;  Location: [facility];  Service: Cardiovascular;  Laterality: Bilateral;   TONSILLECTOMY  7 yoa   US ECHOCARDIOGRAPHY  05/2015   mild concentric LVH, EF 66%, mildly dilated aortic root 4.1cm    There were no vitals filed for this visit.   Subjective Assessment - 09/05/21 1601     Subjective Pt doing well in general, having some back soreness having started his HEP for thoracic extension.    Pertinent History Alejandro Decker is a 71yoM referred to OPPT for evaluation of Left neck pain x1 year, persistent  low-level soreness near the Left upper trap area. Pt seen by sports medicine, noted to have imaging showing DDD, DJD of cervical spine. Also evaluated for transient left ulnar distribution paresthesia noted when on his road bike or on spin bike at gym. Pt denies any specific actiivty limitations but does reports a 25+ year history of cycling without neck pain. Pt is also active in gym for resistance training as well, notes neck sorenss but not made worse by or limiting to his workouts.    Currently in Pain? Yes    Pain Score 2    Left shoulder, upper traps area             INTERVENTION THIS DATE:  -AA/ROM Nustep 4 minutes seat 10, arms 12, level 1  -book openings 20x bilat  -prone press-up x15 -pysioball rollout 1x30sec -kneeling prayer stretch  (pecs/lats) 2x30sec; added to HEP   -Left 1st rib mobilization 2x30sec Grade 4 -ART Left scalenes  -ART Left upper trapezius  -Supine Capital rotation stretch and MET 2x30sec bilat  -seated cervical extension with lower cervical towel block -Ulnar nerve "OK" glide mobilization 1x15  -education on floor extension based exercises for home           PT Education - 09/05/21 1602     Education Details form for exercises    Person(s) Educated Patient  Methods Explanation;Demonstration    Comprehension Verbalized understanding              PT Short Term Goals - 09/01/21 1705       PT SHORT TERM GOAL #1   Title After 3 weeks pt to demonstrate independent HEP performance for routine thoracic and cervical spine moblity work.    Time 3    Period Weeks    Status New    Target Date 09/22/21      PT SHORT TERM GOAL #2   Title Pt to demonstrate improved cervical rotation ROM to 70 degrees bilat, lateral flexion to 28 degrees bilat.    Time 3    Period Weeks    Status New    Target Date 09/22/21               PT Long Term Goals - 09/01/21 1708       PT LONG TERM GOAL #1   Title Pt to improve FOTO score to >75 to  imply improved subjective ease in basic head/neck movements for ADL/IADL.    Time 6    Period Weeks    Status New    Target Date 10/13/21      PT LONG TERM GOAL #2   Title Pt to report ability to perform 60 minutes of cycling or spin bike activity without arm paresthesias.    Baseline Symptoms within 15 minutes    Time 6    Period Weeks    Status New    Target Date 10/13/21      PT LONG TERM GOAL #3   Title Pt will reports no waking paresthesias in Left ulnar distribution for preceding 2 weeks.    Time 6    Period Weeks    Status New    Target Date 10/13/21                   Plan - 09/05/21 1612     Clinical Impression Statement Reviewed new HEP. Conitnued with thoracic mobility work, extension and rotation focus. Started stretching and MET for cervical ROM. Pt has improved cervical ROM this session, also has improved tolerance to Left 1st rib mobilization, and no longer has symptoms with Rt capital rotation. Pt scheduled for bike work tonight at gym, will report back any updates with symptoms.    Personal Factors and Comorbidities Age;Behavior Pattern;Fitness    Examination-Activity Limitations Sleep;Other    Stability/Clinical Decision Making Stable/Uncomplicated    Clinical Decision Making Moderate    Rehab Potential Excellent    PT Frequency 2x / week    PT Duration 6 weeks    PT Treatment/Interventions ADLs/Self Care Home Management;Cryotherapy;Electrical Stimulation;Moist Heat;Neuromuscular re-education;Therapeutic exercise;Therapeutic activities;Functional mobility training;Patient/family education;Dry needling;Passive range of motion;Spinal Manipulations    PT Next Visit Plan Review HEP, repeat 1st rib mobilization    PT Home Exercise Plan towel roll dislocates x20 QD    Consulted and Agree with Plan of Care Patient             Patient will benefit from skilled therapeutic intervention in order to improve the following deficits and impairments:  Postural  dysfunction, Improper body mechanics, Impaired flexibility, Hypomobility, Decreased range of motion  Visit Diagnosis: Cervicalgia  Abnormal posture     Problem List Patient Active Problem List   Diagnosis Date Noted   DDD (degenerative disc disease), cervical 08/18/2021   Acute deep vein thrombosis (DVT) of popliteal vein of right lower extremity ([place]) 01/25/2021   Lesion  of left native kidney 01/25/2021   History of pulmonary embolism 01/12/2021   Venous thromboembolism (VTE) 01/2021   Renal insufficiency 10/15/2019   Lower back pain 10/15/2019   Porokeratosis 10/11/2018   Severe obesity (BMI 35.0-39.9) with comorbidity ([place]) 09/07/2014   Dyspnea on exertion 04/02/2014   Chronic foot pain 12/16/2013   Skin rash 09/17/2013   Healthcare maintenance 03/14/2012   HLD (hyperlipidemia) 12/20/2007   Allergic rhinitis 11/27/2006    5:08 PM, 09/05/21 Etta Grandchild, PT, DPT Physical Therapist - [place] [phone] (Office)   [place] C, PT 09/05/2021, 4:13 PM  [facility] [address] [place], [address] Phone: [phone]   Fax:  [phone]  Name: Alejandro Decker MRN: [id number] Date of Birth: [birth year]

## 2021-09-12 ENCOUNTER — Other Ambulatory Visit: Payer: Self-pay

## 2021-09-12 ENCOUNTER — Ambulatory Visit: Payer: BC Managed Care – PPO | Attending: Family Medicine

## 2021-09-12 DIAGNOSIS — R293 Abnormal posture: Secondary | ICD-10-CM | POA: Insufficient documentation

## 2021-09-12 DIAGNOSIS — M542 Cervicalgia: Secondary | ICD-10-CM | POA: Insufficient documentation

## 2021-09-12 NOTE — Therapy (Signed)
[facility] [address], [place], [address] Phone: [phone]   Fax:  [phone]  Physical Therapy Treatment  Patient Details  Name: Alejandro Decker MRN: [id number] Date of Birth: 10/05/1960 Referring Provider (PT): Owens Loffler MD (sports medicine)   Encounter Date: 09/12/2021   PT End of Session - 09/12/21 1541     Visit Number 3    Number of Visits 12    Date for PT Re-Evaluation 10/13/21    Authorization Type [employer] Comm Pro    Authorization Time Period 09/01/21-10/13/21    Progress Note Due on Visit 10    PT Start Time 1502    PT Stop Time 1542    PT Time Calculation (min) 40 min    Activity Tolerance Patient tolerated treatment well;No increased pain    Behavior During Therapy WFL for tasks assessed/performed             Past Medical History:  Diagnosis Date   GERD (gastroesophageal reflux disease)    Hyperlipemia    statin caused bump in LFTs, saw cards, rec treatment if LDL >130   Incomplete RBBB 05/08/2015   Venous thromboembolism (VTE) 01/2021   RLE DVT + extensive bilateral PE (eliquis)    Past Surgical History:  Procedure Laterality Date   CARDIOVASCULAR STRESS TEST  05/2015   ETT - WNL, EF 58% (Dr Einar Gip)   CT CTA CORONARY W/CA SCORE W/CM &/OR WO/CM  05/2015   score of 6   LASIK  11/17/2009   Dr. Gaspar Bidding, [place], [facility]   PULMONARY THROMBECTOMY Bilateral 01/2021   TPA thrombolysis and thrombectomy for bilateral PE with R heart strain and hypoxia [name])   PULMONARY THROMBECTOMY Bilateral 01/12/2021   Procedure: PULMONARY THROMBECTOMY;  Surgeon: Katha Cabal, MD;  Location: [facility];  Service: Cardiovascular;  Laterality: Bilateral;   TONSILLECTOMY  7 yoa   US ECHOCARDIOGRAPHY  05/2015   mild concentric LVH, EF 66%, mildly dilated aortic root 4.1cm    There were no vitals filed for this visit.   Subjective Assessment - 09/12/21 1518     Subjective Pt  doing well today, continues to work on T spine mobility and HEP at home. Pt was on bike twice since last visit, reports tolerance of 20 minute sprior to onset paresthesias in hand veruss 10 minutes prior to starting PT. Pt reports improved mobility in thoracic adn trunk. Also reports more cervical ROM is tolerated prior to onset tightness.    Pertinent History [name] is a 6yoM referred to OPPT for evaluation of Left neck pain x1 year, persistent low-level soreness near the Left upper trap area. Pt seen by sports medicine, noted to have imaging showing DDD, DJD of cervical spine. Also evaluated for transient left ulnar distribution paresthesia noted when on his road bike or on spin bike at gym. Pt denies any specific actiivty limitations but does reports a 25+ year history of cycling without neck pain. Pt is also active in gym for resistance training as well, notes neck sorenss but not made worse by or limiting to his workouts.    Currently in Pain? Yes             INTERVENTION THIS DATE:  -AA/ROM Nustep 4 minutes seat 10, arms 12, level 2 -wand flexion on lonmgitudinal half roll x20, T6 half roll x20 -quadruped UE extension + lateral rotation 1x12 bilat   -Unilateral AP mobilization C6/7, C7/T1 2x30sec each, Grade  IV  -Supine Cervical Rotation stretch with self overpressures 2x30sec bilat -supine cervical retraction 1x15x5secH into 2 pillows      PT Education - 09/12/21 1530     Education Details reviewed HEP for spine mobility and warmup at gym    Person(s) Educated Patient    Methods Explanation    Comprehension Verbalized understanding;Returned demonstration              PT Short Term Goals - 09/01/21 1705       PT SHORT TERM GOAL #1   Title After 3 weeks pt to demonstrate independent HEP performance for routine thoracic and cervical spine moblity work.    Time 3    Period Weeks    Status New    Target Date 09/22/21      PT SHORT TERM GOAL #2   Title Pt to  demonstrate improved cervical rotation ROM to 70 degrees bilat, lateral flexion to 28 degrees bilat.    Time 3    Period Weeks    Status New    Target Date 09/22/21               PT Long Term Goals - 09/01/21 1708       PT LONG TERM GOAL #1   Title Pt to improve FOTO score to >75 to imply improved subjective ease in basic head/neck movements for ADL/IADL.    Time 6    Period Weeks    Status New    Target Date 10/13/21      PT LONG TERM GOAL #2   Title Pt to report ability to perform 60 minutes of cycling or spin bike activity without arm paresthesias.    Baseline Symptoms within 15 minutes    Time 6    Period Weeks    Status New    Target Date 10/13/21      PT LONG TERM GOAL #3   Title Pt will reports no waking paresthesias in Left ulnar distribution for preceding 2 weeks.    Time 6    Period Weeks    Status New    Target Date 10/13/21                   Plan - 09/12/21 1543     Clinical Impression Statement Activity tolerance improving gradually, [perceived mobility also improved. Pt reporting excellent compliance with home program. Good tolerance to manual therapies and interventions in general in session with out symptoms exacerbation. Added cervical rotation stretch to HEP.    Personal Factors and Comorbidities Age;Behavior Pattern;Fitness    Examination-Activity Limitations Sleep;Other    Stability/Clinical Decision Making Stable/Uncomplicated    Clinical Decision Making Moderate    Rehab Potential Excellent    PT Frequency 2x / week    PT Duration 6 weeks    PT Treatment/Interventions ADLs/Self Care Home Management;Cryotherapy;Electrical Stimulation;Moist Heat;Neuromuscular re-education;Therapeutic exercise;Therapeutic activities;Functional mobility training;Patient/family education;Dry needling;Passive range of motion;Spinal Manipulations    PT Next Visit Plan Review HEP updates    Consulted and Agree with Plan of Care Patient              Patient will benefit from skilled therapeutic intervention in order to improve the following deficits and impairments:  Postural dysfunction, Improper body mechanics, Impaired flexibility, Hypomobility, Decreased range of motion  Visit Diagnosis: Cervicalgia  Abnormal posture     Problem List Patient Active Problem List   Diagnosis Date Noted   DDD (degenerative disc disease), cervical 08/18/2021   Acute deep  vein thrombosis (DVT) of popliteal vein of right lower extremity ([place]) 01/25/2021   Lesion of left native kidney 01/25/2021   History of pulmonary embolism 01/12/2021   Venous thromboembolism (VTE) 01/2021   Renal insufficiency 10/15/2019   Lower back pain 10/15/2019   Porokeratosis 10/11/2018   Severe obesity (BMI 35.0-39.9) with comorbidity ([place]) 09/07/2014   Dyspnea on exertion 04/02/2014   Chronic foot pain 12/16/2013   Skin rash 09/17/2013   Healthcare maintenance 03/14/2012   HLD (hyperlipidemia) 12/20/2007   Allergic rhinitis 11/27/2006   6:09 PM, 09/12/21 Etta Grandchild, PT, DPT Physical Therapist - [place] [phone] (Office)   Gideon C, PT 09/12/2021, 3:44 PM  [facility] [address] [place], [address] Phone: [phone]   Fax:  [phone]  Name: KILEY TORRENCE MRN: [id number] Date of Birth: [birth year]

## 2021-09-19 ENCOUNTER — Ambulatory Visit: Payer: BC Managed Care – PPO

## 2021-09-19 ENCOUNTER — Other Ambulatory Visit: Payer: Self-pay

## 2021-09-19 ENCOUNTER — Encounter: Payer: Self-pay | Admitting: Physical Therapy

## 2021-09-19 DIAGNOSIS — M542 Cervicalgia: Secondary | ICD-10-CM | POA: Diagnosis not present

## 2021-09-19 DIAGNOSIS — R293 Abnormal posture: Secondary | ICD-10-CM | POA: Diagnosis not present

## 2021-09-19 NOTE — Therapy (Signed)
[facility] [address] [place], [address] Phone: [phone]   Fax:  [phone]  Physical Therapy Treatment  Patient Details  Name: Alejandro Decker MRN: [id number] Date of Birth: April 02, 1961 Referring Provider (PT): Owens Loffler MD (sports medicine)   Encounter Date: 09/19/2021   PT End of Session - 09/19/21 1335     Visit Number 4    Number of Visits 12    Date for PT Re-Evaluation 10/13/21    Authorization Type [employer] Comm Pro    Authorization Time Period 09/01/21-10/13/21    Progress Note Due on Visit 10    PT Start Time 1330    PT Stop Time 1411    PT Time Calculation (min) 41 min    Activity Tolerance Patient tolerated treatment well;No increased pain    Behavior During Therapy WFL for tasks assessed/performed             Past Medical History:  Diagnosis Date   GERD (gastroesophageal reflux disease)    Hyperlipemia    statin caused bump in LFTs, saw cards, rec treatment if LDL >130   Incomplete RBBB 05/08/2015   Venous thromboembolism (VTE) 01/2021   RLE DVT + extensive bilateral PE (eliquis)    Past Surgical History:  Procedure Laterality Date   CARDIOVASCULAR STRESS TEST  05/2015   ETT - WNL, EF 58% (Dr Einar Gip)   CT CTA CORONARY W/CA SCORE W/CM &/OR WO/CM  05/2015   score of 6   LASIK  11/17/2009   Dr. Gaspar Bidding, [place], [facility]   PULMONARY THROMBECTOMY Bilateral 01/2021   TPA thrombolysis and thrombectomy for bilateral PE with R heart strain and hypoxia [name])   PULMONARY THROMBECTOMY Bilateral 01/12/2021   Procedure: PULMONARY THROMBECTOMY;  Surgeon: Katha Cabal, MD;  Location: [facility];  Service: Cardiovascular;  Laterality: Bilateral;   TONSILLECTOMY  7 yoa   US ECHOCARDIOGRAPHY  05/2015   mild concentric LVH, EF 66%, mildly dilated aortic root 4.1cm    There were no vitals filed for this visit.   Subjective Assessment - 09/19/21 1333     Subjective  Pt reports 1/10 cervical pain today. Pt reports dissipated hand parasthesias with exercises.    Pertinent History Alejandro Decker is a 12yoM referred to OPPT for evaluation of Left neck pain x1 year, persistent low-level soreness near the Left upper trap area. Pt seen by sports medicine, noted to have imaging showing DDD, DJD of cervical spine. Also evaluated for transient left ulnar distribution paresthesia noted when on his road bike or on spin bike at gym. Pt denies any specific actiivty limitations but does reports a 25+ year history of cycling without neck pain. Pt is also active in gym for resistance training as well, notes neck sorenss but not made worse by or limiting to his workouts.    Currently in Pain? Yes    Pain Score 1     Pain Location Neck             There.ex:   Nustep 5 minutes seat 10, arms 12, level 2 Supine cervical rotation stretch with overpressure: 3x30sec/side. Good understanding of form/technique.   Wand flexion on longitudinal half roll x20 for thoracic extension  Physioball roll outs forwards x10, R/L deviations x10/side, 2-3 sec holds  Seated cervical retraction: x20, min VC's and initial PT demo. Good carryover after cues  Open books: x20 bilat. Min VC's for performing in full ranges of motion.  PT briefly reviewed cat/cow for HEP with min cueing on full thoracic extension x12  Standing incline wall pushups on yoga blocks for increased parascapular strength and pec lengthening 1 x 12      PT Education - 09/19/21 1335     Education Details form/technique with exercise.    Person(s) Educated Patient    Methods Explanation;Demonstration;Tactile cues;Verbal cues    Comprehension Verbalized understanding;Returned demonstration              PT Short Term Goals - 09/01/21 1705       PT SHORT TERM GOAL #1   Title After 3 weeks pt to demonstrate independent HEP performance for routine thoracic and cervical spine moblity work.    Time 3    Period Weeks     Status New    Target Date 09/22/21      PT SHORT TERM GOAL #2   Title Pt to demonstrate improved cervical rotation ROM to 70 degrees bilat, lateral flexion to 28 degrees bilat.    Time 3    Period Weeks    Status New    Target Date 09/22/21               PT Long Term Goals - 09/01/21 1708       PT LONG TERM GOAL #1   Title Pt to improve FOTO score to >75 to imply improved subjective ease in basic head/neck movements for ADL/IADL.    Time 6    Period Weeks    Status New    Target Date 10/13/21      PT LONG TERM GOAL #2   Title Pt to report ability to perform 60 minutes of cycling or spin bike activity without arm paresthesias.    Baseline Symptoms within 15 minutes    Time 6    Period Weeks    Status New    Target Date 10/13/21      PT LONG TERM GOAL #3   Title Pt will reports no waking paresthesias in Left ulnar distribution for preceding 2 weeks.    Time 6    Period Weeks    Status New    Target Date 10/13/21                   Plan - 09/19/21 1421     Clinical Impression Statement Continuing primary PT POC with focus on cervicothoracic mobility. Pt demonstrating excellent understanding overall for all exercises without exacerbation of symptoms. Pt educated and given additional exercise of cervical retraction for cervical extension and deep neck flexor activation. Pt will continue to benefit from skilled PT services to progress cervical and thoracic mobility and further decrease pain to return to pain free functional and recreational mobility.    Personal Factors and Comorbidities Age;Behavior Pattern;Fitness    Examination-Activity Limitations Sleep;Other    Stability/Clinical Decision Making Stable/Uncomplicated    Rehab Potential Excellent    PT Frequency 2x / week    PT Duration 6 weeks    PT Treatment/Interventions ADLs/Self Care Home Management;Cryotherapy;Electrical Stimulation;Moist Heat;Neuromuscular re-education;Therapeutic exercise;Therapeutic  activities;Functional mobility training;Patient/family education;Dry needling;Passive range of motion;Spinal Manipulations    PT Next Visit Plan Review HEP updates    Consulted and Agree with Plan of Care Patient             Patient will benefit from skilled therapeutic intervention in order to improve the following deficits and impairments:  Postural dysfunction, Improper body mechanics, Impaired flexibility, Hypomobility, Decreased range of motion  Visit Diagnosis: Cervicalgia  Abnormal posture     Problem List Patient Active Problem List   Diagnosis Date Noted   DDD (degenerative disc disease), cervical 08/18/2021   Acute deep vein thrombosis (DVT) of popliteal vein of right lower extremity ([place]) 01/25/2021   Lesion of left native kidney 01/25/2021   History of pulmonary embolism 01/12/2021   Venous thromboembolism (VTE) 01/2021   Renal insufficiency 10/15/2019   Lower back pain 10/15/2019   Porokeratosis 10/11/2018   Severe obesity (BMI 35.0-39.9) with comorbidity ([place]) 09/07/2014   Dyspnea on exertion 04/02/2014   Chronic foot pain 12/16/2013   Skin rash 09/17/2013   Healthcare maintenance 03/14/2012   HLD (hyperlipidemia) 12/20/2007   Allergic rhinitis 11/27/2006    Salem Caster. Fairly IV, PT, DPT Physical Therapist- [facility]  09/19/2021, 2:33 PM  [facility] [address], [place], [address] Phone: [phone]   Fax:  [phone]  Name: Alejandro Decker MRN: [id number] Date of Birth: Mar 17, 1961

## 2021-09-20 DIAGNOSIS — L817 Pigmented purpuric dermatosis: Secondary | ICD-10-CM | POA: Diagnosis not present

## 2021-09-20 DIAGNOSIS — L814 Other melanin hyperpigmentation: Secondary | ICD-10-CM | POA: Diagnosis not present

## 2021-09-20 DIAGNOSIS — L821 Other seborrheic keratosis: Secondary | ICD-10-CM | POA: Diagnosis not present

## 2021-09-20 DIAGNOSIS — L578 Other skin changes due to chronic exposure to nonionizing radiation: Secondary | ICD-10-CM | POA: Diagnosis not present

## 2021-09-26 ENCOUNTER — Ambulatory Visit: Payer: BC Managed Care – PPO

## 2021-09-26 ENCOUNTER — Other Ambulatory Visit: Payer: Self-pay

## 2021-09-26 ENCOUNTER — Encounter: Payer: Self-pay | Admitting: Physical Therapy

## 2021-09-26 DIAGNOSIS — M542 Cervicalgia: Secondary | ICD-10-CM | POA: Diagnosis not present

## 2021-09-26 DIAGNOSIS — R293 Abnormal posture: Secondary | ICD-10-CM | POA: Diagnosis not present

## 2021-09-26 NOTE — Therapy (Signed)
[facility] [address], [place], [address] Phone: [phone]   Fax:  [phone]  Physical Therapy Treatment  Patient Details  Name: Alejandro Decker MRN: [id number] Date of Birth: [birth year] Referring Provider (PT): Alejandro Loffler MD (sports medicine)   Encounter Date: 09/26/2021   PT End of Session - 09/26/21 1504     Visit Number 5    Number of Visits 12    Date for PT Re-Evaluation 10/13/21    Authorization Type [employer]BS Comm Pro    Authorization Time Period 09/01/21-10/13/21    Progress Note Due on Visit 10    PT Start Time 1501    PT Stop Time 1542    PT Time Calculation (min) 41 min    Activity Tolerance Patient tolerated treatment well;No increased pain    Behavior During Therapy WFL for tasks assessed/performed             Past Medical History:  Diagnosis Date   GERD (gastroesophageal reflux disease)    Hyperlipemia    statin caused bump in LFTs, saw cards, rec treatment if LDL >130   Incomplete RBBB 05/08/2015   Venous thromboembolism (VTE) 01/2021   RLE DVT + extensive bilateral PE (eliquis)    Past Surgical History:  Procedure Laterality Date   CARDIOVASCULAR STRESS TEST  05/2015   ETT - WNL, EF 58% (Dr Alejandro Decker)   CT CTA CORONARY W/CA SCORE W/CM &/OR WO/CM  05/2015   score of 6   LASIK  11/17/2009   Dr. Gaspar Decker, [place], [facility]   PULMONARY THROMBECTOMY Bilateral 01/2021   TPA thrombolysis and thrombectomy for bilateral PE with R heart strain and hypoxia Alejandro Decker)   PULMONARY THROMBECTOMY Bilateral 01/12/2021   Procedure: PULMONARY THROMBECTOMY;  Surgeon: Alejandro Cabal, MD;  Location: [facility];  Service: Cardiovascular;  Laterality: Bilateral;   TONSILLECTOMY  7 yoa   US ECHOCARDIOGRAPHY  05/2015   mild concentric LVH, EF 66%, mildly dilated aortic root 4.1cm    There were no vitals filed for this visit.   Subjective Assessment - 09/26/21 1503     Subjective  Pt endorsing 2/10 pain in cervical spine. Able to tolerate up to 30 min on stationary bike prior to ulnar paresthesias.    Pertinent History Alejandro Decker is a 76yoM referred to OPPT for evaluation of Left neck pain x1 year, persistent low-level soreness near the Left upper trap area. Pt seen by sports medicine, noted to have imaging showing DDD, DJD of cervical spine. Also evaluated for transient left ulnar distribution paresthesia noted when on his road bike or on spin bike at gym. Pt denies any specific actiivty limitations but does reports a 25+ year history of cycling without neck pain. Pt is also active in gym for resistance training as well, notes neck sorenss but not made worse by or limiting to his workouts.    Currently in Pain? Yes    Pain Score 2     Pain Location Neck            There.ex:   Nu-Step L5 for 5 min. UE/LE use  Seated cervical retraction: 2x20. Good form/technique.   Wand flexion on longitudinal half roll x20 with 5# AW on wand for thoracic extension             Physioball roll outs forwards x10, R/L deviations x10/side, 2-3 sec holds  Standing incline wall pushups on yoga blocks for increased parascapular strength and  pec lengthening 2 x 12  OMEGA exercises    Lat pull down: 65 #, 1x12, 85# 1x12    Scap retractions: 65#, 1x10, 55# 1x12  Standing alternating 9# DB punches for thoracic rotation and postural strengthening: 2x10/UE staggered stance      PT Education - 09/26/21 1504     Education Details form/technique with exercise.    Person(s) Educated Patient    Methods Explanation;Demonstration;Tactile cues;Verbal cues    Comprehension Verbalized understanding;Returned demonstration              PT Short Term Goals - 09/01/21 1705       PT SHORT TERM GOAL #1   Title After 3 weeks pt to demonstrate independent HEP performance for routine thoracic and cervical spine moblity work.    Time 3    Period Weeks    Status New    Target Date 09/22/21       PT SHORT TERM GOAL #2   Title Pt to demonstrate improved cervical rotation ROM to 70 degrees bilat, lateral flexion to 28 degrees bilat.    Time 3    Period Weeks    Status New    Target Date 09/22/21               PT Long Term Goals - 09/01/21 1708       PT LONG TERM GOAL #1   Title Pt to improve FOTO score to >75 to imply improved subjective ease in basic head/neck movements for ADL/IADL.    Time 6    Period Weeks    Status New    Target Date 10/13/21      PT LONG TERM GOAL #2   Title Pt to report ability to perform 60 minutes of cycling or spin bike activity without arm paresthesias.    Baseline Symptoms within 15 minutes    Time 6    Period Weeks    Status New    Target Date 10/13/21      PT LONG TERM GOAL #3   Title Pt will reports no waking paresthesias in Left ulnar distribution for preceding 2 weeks.    Time 6    Period Weeks    Status New    Target Date 10/13/21                   Plan - 09/26/21 1517     Clinical Impression Statement Continuing PT POC with focus on cervicothoracic mobility. Continuing to display excellent understanding of exercises. Good carryover with addition of cervical retractions after last session. Continuing to still demo minor neck pain since eval however. Pt will continue to benefit from skilled PT services to further progress cervical and thoracic mobility and decreased neck pain to return to pain free functional and recreational mobility.    Personal Factors and Comorbidities Age;Behavior Pattern;Fitness    Examination-Activity Limitations Sleep;Other    Stability/Clinical Decision Making Stable/Uncomplicated    Rehab Potential Excellent    PT Frequency 2x / week    PT Duration 6 weeks    PT Treatment/Interventions ADLs/Self Care Home Management;Cryotherapy;Electrical Stimulation;Moist Heat;Neuromuscular re-education;Therapeutic exercise;Therapeutic activities;Functional mobility training;Patient/family education;Dry  needling;Passive range of motion;Spinal Manipulations    PT Next Visit Plan Review HEP updates    Consulted and Agree with Plan of Care Patient             Patient will benefit from skilled therapeutic intervention in order to improve the following deficits and impairments:  Postural dysfunction, Improper body  mechanics, Impaired flexibility, Hypomobility, Decreased range of motion  Visit Diagnosis: Cervicalgia  Abnormal posture     Problem List Patient Active Problem List   Diagnosis Date Noted   DDD (degenerative disc disease), cervical 08/18/2021   Acute deep vein thrombosis (DVT) of popliteal vein of right lower extremity ([place]) 01/25/2021   Lesion of left native kidney 01/25/2021   History of pulmonary embolism 01/12/2021   Venous thromboembolism (VTE) 01/2021   Renal insufficiency 10/15/2019   Lower back pain 10/15/2019   Porokeratosis 10/11/2018   Severe obesity (BMI 35.0-39.9) with comorbidity ([place]) 09/07/2014   Dyspnea on exertion 04/02/2014   Chronic foot pain 12/16/2013   Skin rash 09/17/2013   Healthcare maintenance 03/14/2012   HLD (hyperlipidemia) 12/20/2007   Allergic rhinitis 11/27/2006    Salem Caster. Fairly IV, PT, DPT Physical Therapist- [facility]  09/26/2021, 3:45 PM  [facility] [address] [place], [address] Phone: [phone]   Fax:  [phone]  Name: PRANEEL HAISLEY MRN: [id number] Date of Birth: Dec 18, 1960

## 2021-10-03 ENCOUNTER — Other Ambulatory Visit: Payer: Self-pay

## 2021-10-03 ENCOUNTER — Ambulatory Visit: Payer: BC Managed Care – PPO | Admitting: Physical Therapy

## 2021-10-03 DIAGNOSIS — M542 Cervicalgia: Secondary | ICD-10-CM

## 2021-10-03 DIAGNOSIS — R293 Abnormal posture: Secondary | ICD-10-CM

## 2021-10-03 NOTE — Therapy (Addendum)
[facility] [address], [place], [address] Phone: [phone]   Fax:  [phone]  Physical Therapy Treatment  Patient Details  Name: Alejandro Decker MRN: [id number] Date of Birth: Aug 09, 1960 Referring Provider (PT): Owens Loffler MD (sports medicine)   Encounter Date: 10/03/2021   PT End of Session - 10/03/21 1915     Visit Number 6    Number of Visits 12    Date for PT Re-Evaluation 10/13/21    Authorization Type [employer]    Authorization Time Period 09/01/21-10/13/21    Progress Note Due on Visit 10    PT Start Time 1500    PT Stop Time 1543    PT Time Calculation (min) 43 min    Activity Tolerance Patient tolerated treatment well;No increased pain    Behavior During Therapy WFL for tasks assessed/performed             Past Medical History:  Diagnosis Date   GERD (gastroesophageal reflux disease)    Hyperlipemia    statin caused bump in LFTs, saw cards, rec treatment if LDL >130   Incomplete RBBB 05/08/2015   Venous thromboembolism (VTE) 01/2021   RLE DVT + extensive bilateral PE ([place])    Past Surgical History:  Procedure Laterality Date   CARDIOVASCULAR STRESS TEST  05/2015   ETT - WNL, EF 58% (Dr Einar Gip)   CT CTA CORONARY W/CA SCORE W/CM &/OR WO/CM  05/2015   score of 6   LASIK  11/17/2009   Dr. Gaspar Bidding, [place], [facility]   PULMONARY THROMBECTOMY Bilateral 01/2021   TPA thrombolysis and thrombectomy for bilateral PE with R heart strain and hypoxia [name])   PULMONARY THROMBECTOMY Bilateral 01/12/2021   Procedure: PULMONARY THROMBECTOMY;  Surgeon: Katha Cabal, MD;  Location: [facility];  Service: Cardiovascular;  Laterality: Bilateral;   TONSILLECTOMY  7 yoa   US ECHOCARDIOGRAPHY  05/2015   mild concentric LVH, EF 66%, mildly dilated aortic root 4.1cm    There were no vitals filed for this visit.   Subjective Assessment - 10/03/21 1512     Subjective  Pt endorsing 1-2/10 pain in cervical spine.  Pt reports no new complaints upon arrival, however does not note any improvement in the pain.  Pt does however support that his back has become a lot more loose and he feels much better there.    Pertinent History Alejandro Decker is a 44yoM referred to OPPT for evaluation of Left neck pain x1 year, persistent low-level soreness near the Left upper trap area. Pt seen by sports medicine, noted to have imaging showing DDD, DJD of cervical spine. Also evaluated for transient left ulnar distribution paresthesia noted when on his road bike or on spin bike at gym. Pt denies any specific actiivty limitations but does reports a 25+ year history of cycling without neck pain. Pt is also active in gym for resistance training as well, notes neck sorenss but not made worse by or limiting to his workouts.                Interventions this date:  ThereEx:   Nu-Step L5 for 5 min. UE/LE use Seated cervical retraction: 2x20. Good form/technique.  Physioball roll outs forwards x10, R/L deviations x10/side, 2-3 sec holds Standing incline wall pushups on yoga blocks for increased parascapular strength and pec lengthening 2x12  OMEGA exercises  Lat pull down: 65 #, 1x12, 85# 1x12  Scap retractions: 65#,  1x10, 55# 1x12 Supine serratus punches, 15# DB, 3x15   Manual Therapy:  Supine STM to cervical region to increase extensibility of the paraspinals Supine suboccipital release technique to decrease cervicalgia Supine manual traction performed in order to increase joint space in cervical region for pain relief Supine cervical upglides/downglides, 30 sec bouts Supine UT/Levator stretch, 30 sec bouts to increase tissue extensibility of the cervical region   Pt educated on dry needling and the technique and reasoning behind performance of it.    TDN performed by Alejandro Decker:  Trigger Point Dry Needling (TDN), unbilled. Education performed with patient regarding  potential benefit of TDN. Reviewed precautions and risks with patient. Pt provided verbal consent to treatment. TDN performed with 0.30 x 60 single needle placements with local twitch response (LTR). Pistoning technique utilized. Improved pain-free motion following intervention. Muscles targeted: left thoracic paraspinals and multifidi.   Signature: Alejandro Decker, DPT        PT Short Term Goals - 09/01/21 1705       PT SHORT TERM GOAL #1   Title After 3 weeks pt to demonstrate independent HEP performance for routine thoracic and cervical spine moblity work.    Time 3    Period Weeks    Status New    Target Date 09/22/21      PT SHORT TERM GOAL #2   Title Pt to demonstrate improved cervical rotation ROM to 70 degrees bilat, lateral flexion to 28 degrees bilat.    Time 3    Period Weeks    Status New    Target Date 09/22/21               PT Long Term Goals - 09/01/21 1708       PT LONG TERM GOAL #1   Title Pt to improve FOTO score to >75 to imply improved subjective ease in basic head/neck movements for ADL/IADL.    Time 6    Period Weeks    Status New    Target Date 10/13/21      PT LONG TERM GOAL #2   Title Pt to report ability to perform 60 minutes of cycling or spin bike activity without arm paresthesias.    Baseline Symptoms within 15 minutes    Time 6    Period Weeks    Status New    Target Date 10/13/21      PT LONG TERM GOAL #3   Title Pt will reports no waking paresthesias in Left ulnar distribution for preceding 2 weeks.    Time 6    Period Weeks    Status New    Target Date 10/13/21                   Plan - 10/03/21 1912     Clinical Impression Statement Pt performed well with manual therapy approaches and noted to be feeling much better wiht improved ROM following completion of therapy session.  Pt also underwent dry-needling after being properly edcuated on the benefits and risks as well.  Pt agreeable and underwent TP technique with  good results and muscle twitching response as expected in the thoracic paraspinals.  Pt will be reassessed at next visit for results following this session.    Personal Factors and Comorbidities Age;Behavior Pattern;Fitness    Examination-Activity Limitations Sleep;Other    Stability/Clinical Decision Making Stable/Uncomplicated    Rehab Potential Excellent    PT Frequency 2x / week    PT Duration 6 weeks  PT Treatment/Interventions ADLs/Self Care Home Management;Cryotherapy;Electrical Stimulation;Moist Heat;Neuromuscular re-education;Therapeutic exercise;Therapeutic activities;Functional mobility training;Patient/family education;Dry needling;Passive range of motion;Spinal Manipulations    PT Next Visit Plan Review HEP updates    Consulted and Agree with Plan of Care Patient             Patient will benefit from skilled therapeutic intervention in order to improve the following deficits and impairments:  Postural dysfunction, Improper body mechanics, Impaired flexibility, Hypomobility, Decreased range of motion  Visit Diagnosis: Cervicalgia  Abnormal posture     Problem List Patient Active Problem List   Diagnosis Date Noted   DDD (degenerative disc disease), cervical 08/18/2021   Acute deep vein thrombosis (DVT) of popliteal vein of right lower extremity ([place]) 01/25/2021   Lesion of left native kidney 01/25/2021   History of pulmonary embolism 01/12/2021   Venous thromboembolism (VTE) 01/2021   Renal insufficiency 10/15/2019   Lower back pain 10/15/2019   Porokeratosis 10/11/2018   Severe obesity (BMI 35.0-39.9) with comorbidity ([place]) 09/07/2014   Dyspnea on exertion 04/02/2014   Chronic foot pain 12/16/2013   Skin rash 09/17/2013   Healthcare maintenance 03/14/2012   HLD (hyperlipidemia) 12/20/2007   Allergic rhinitis 11/27/2006    Gwenlyn Saran, PT, DPT 10/03/21, 7:17 PM   [facility] [address] [place], [address] Phone: [phone]   Fax:  [phone]  Name: Alejandro Decker MRN: [id number] Date of Birth: 04-23-1961

## 2021-11-10 ENCOUNTER — Other Ambulatory Visit: Payer: Self-pay | Admitting: Family Medicine

## 2021-11-10 DIAGNOSIS — G8929 Other chronic pain: Secondary | ICD-10-CM

## 2021-11-10 DIAGNOSIS — M5030 Other cervical disc degeneration, unspecified cervical region: Secondary | ICD-10-CM

## 2021-11-10 NOTE — Telephone Encounter (Signed)
Last office visit 08/17/2021 with Dr. Lorelei Pont for DDD.  Last refilled 08/17/2021 for #30 with 2 refills.  CPE scheduled with PCP on 12/27/2021.  Ok to refill? ?

## 2021-12-02 ENCOUNTER — Other Ambulatory Visit: Payer: Self-pay | Admitting: Family Medicine

## 2021-12-02 DIAGNOSIS — M5030 Other cervical disc degeneration, unspecified cervical region: Secondary | ICD-10-CM

## 2021-12-02 DIAGNOSIS — G8929 Other chronic pain: Secondary | ICD-10-CM

## 2021-12-02 NOTE — Telephone Encounter (Signed)
Last office visit 08/17/21 with Dr. Lorelei Pont for DDD  Last refilled 11/10/2021 for #30 with 2 refills by Dr. Lorelei Pont.  CPE scheduled with Dr. Darnell Level 12/27/21.  Ok to refill? ?

## 2021-12-03 NOTE — Telephone Encounter (Signed)
I think this is probably reasonable.  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  I will refill today.  He has regular CPE with Dr. Darnell Level in 1 month, I think that he can be involved with prescription management long-term.  I am always available, but I do not think that he needs long-term care with me. ?

## 2021-12-18 ENCOUNTER — Other Ambulatory Visit: Payer: Self-pay | Admitting: Family Medicine

## 2021-12-18 DIAGNOSIS — N289 Disorder of kidney and ureter, unspecified: Secondary | ICD-10-CM

## 2021-12-18 DIAGNOSIS — E785 Hyperlipidemia, unspecified: Secondary | ICD-10-CM

## 2021-12-18 DIAGNOSIS — E559 Vitamin D deficiency, unspecified: Secondary | ICD-10-CM | POA: Insufficient documentation

## 2021-12-20 ENCOUNTER — Other Ambulatory Visit (INDEPENDENT_AMBULATORY_CARE_PROVIDER_SITE_OTHER): Payer: BC Managed Care – PPO

## 2021-12-20 DIAGNOSIS — E559 Vitamin D deficiency, unspecified: Secondary | ICD-10-CM | POA: Diagnosis not present

## 2021-12-20 DIAGNOSIS — N289 Disorder of kidney and ureter, unspecified: Secondary | ICD-10-CM | POA: Diagnosis not present

## 2021-12-20 DIAGNOSIS — E785 Hyperlipidemia, unspecified: Secondary | ICD-10-CM | POA: Diagnosis not present

## 2021-12-20 LAB — COMPREHENSIVE METABOLIC PANEL
ALT: 26 U/L (ref 0–53)
AST: 26 U/L (ref 0–37)
Albumin: 4.3 g/dL (ref 3.5–5.2)
Alkaline Phosphatase: 65 U/L (ref 39–117)
BUN: 25 mg/dL — ABNORMAL HIGH (ref 6–23)
CO2: 26 mEq/L (ref 19–32)
Calcium: 9.4 mg/dL (ref 8.4–10.5)
Chloride: 104 mEq/L (ref 96–112)
Creatinine, Ser: 1.48 mg/dL (ref 0.40–1.50)
GFR: 51.39 mL/min — ABNORMAL LOW (ref >60.00)
Glucose, Bld: 98 mg/dL (ref 70–99)
Potassium: 4.4 mEq/L (ref 3.5–5.1)
Sodium: 140 mEq/L (ref 135–145)
Total Bilirubin: 0.6 mg/dL (ref 0.2–1.2)
Total Protein: 6.7 g/dL (ref 6.0–8.3)

## 2021-12-20 LAB — LIPID PANEL
Cholesterol: 201 mg/dL — ABNORMAL HIGH (ref 0–200)
HDL: 69.1 mg/dL (ref >39.00)
LDL Cholesterol: 118 mg/dL — ABNORMAL HIGH (ref 0–99)
NonHDL: 132.25
Total CHOL/HDL Ratio: 3
Triglycerides: 69 mg/dL (ref 0.0–149.0)
VLDL: 13.8 mg/dL (ref 0.0–40.0)

## 2021-12-20 LAB — URINALYSIS, ROUTINE W REFLEX MICROSCOPIC
Bilirubin Urine: NEGATIVE
Hgb urine dipstick: NEGATIVE
Ketones, ur: NEGATIVE
Leukocytes,Ua: NEGATIVE
Nitrite: NEGATIVE
Specific Gravity, Urine: 1.025 (ref 1.000–1.030)
Total Protein, Urine: NEGATIVE
Urine Glucose: NEGATIVE
Urobilinogen, UA: 0.2 (ref 0.0–1.0)
pH: 6 (ref 5.0–8.0)

## 2021-12-20 LAB — VITAMIN D 25 HYDROXY (VIT D DEFICIENCY, FRACTURES): VITD: 27.48 ng/mL — ABNORMAL LOW (ref 30.00–100.00)

## 2021-12-20 LAB — CBC WITH DIFFERENTIAL/PLATELET
Basophils Absolute: 0 10*3/uL (ref 0.0–0.1)
Basophils Relative: 0.8 % (ref 0.0–3.0)
Eosinophils Absolute: 0.6 10*3/uL (ref 0.0–0.7)
Eosinophils Relative: 10.9 % — ABNORMAL HIGH (ref 0.0–5.0)
HCT: 43.3 % (ref 39.0–52.0)
Hemoglobin: 14.7 g/dL (ref 13.0–17.0)
Lymphocytes Relative: 28.5 % (ref 12.0–46.0)
Lymphs Abs: 1.5 10*3/uL (ref 0.7–4.0)
MCHC: 33.8 g/dL (ref 30.0–36.0)
MCV: 93.6 fl (ref 78.0–100.0)
Monocytes Absolute: 0.4 10*3/uL (ref 0.1–1.0)
Monocytes Relative: 7.6 % (ref 3.0–12.0)
Neutro Abs: 2.8 10*3/uL (ref 1.4–7.7)
Neutrophils Relative %: 52.2 % (ref 43.0–77.0)
Platelets: 227 10*3/uL (ref 150.0–400.0)
RBC: 4.63 Mil/uL (ref 4.22–5.81)
RDW: 13 % (ref 11.5–15.5)
WBC: 5.4 10*3/uL (ref 4.0–10.5)

## 2021-12-27 ENCOUNTER — Encounter: Payer: Self-pay | Admitting: Family Medicine

## 2021-12-27 ENCOUNTER — Ambulatory Visit (INDEPENDENT_AMBULATORY_CARE_PROVIDER_SITE_OTHER): Payer: BC Managed Care – PPO | Admitting: Family Medicine

## 2021-12-27 VITALS — BP 130/78 | HR 75 | Temp 98.0°F | Ht 69.0 in | Wt 249.5 lb

## 2021-12-27 DIAGNOSIS — Z1211 Encounter for screening for malignant neoplasm of colon: Secondary | ICD-10-CM

## 2021-12-27 DIAGNOSIS — Z0000 Encounter for general adult medical examination without abnormal findings: Secondary | ICD-10-CM

## 2021-12-27 DIAGNOSIS — E785 Hyperlipidemia, unspecified: Secondary | ICD-10-CM

## 2021-12-27 DIAGNOSIS — M5030 Other cervical disc degeneration, unspecified cervical region: Secondary | ICD-10-CM

## 2021-12-27 DIAGNOSIS — E559 Vitamin D deficiency, unspecified: Secondary | ICD-10-CM

## 2021-12-27 DIAGNOSIS — Z86711 Personal history of pulmonary embolism: Secondary | ICD-10-CM

## 2021-12-27 DIAGNOSIS — R030 Elevated blood-pressure reading, without diagnosis of hypertension: Secondary | ICD-10-CM

## 2021-12-27 DIAGNOSIS — N289 Disorder of kidney and ureter, unspecified: Secondary | ICD-10-CM

## 2021-12-27 MED ORDER — APIXABAN 5 MG PO TABS: 5.0000 mg | ORAL_TABLET | Freq: Two times a day (BID) | ORAL | 3 refills | Status: DC

## 2021-12-27 MED ORDER — VITAMIN D3 25 MCG (1000 UT) PO CAPS: 1.0000 | ORAL_CAPSULE | Freq: Every day | ORAL | Status: DC

## 2021-12-27 NOTE — Patient Instructions (Addendum)
Sign release of information for imaging study done at [facility] last year.  We will call you to set up a colonoscopy.  Start monitoring blood pressures at home, let me know if consistently >140/90.  Ensure good water intake, limit salt/sodium in the diet, ensure good fruits/vegetable intake. Increase water by 1-2 8oz cups/day.  You are doing well today Return as needed or in 1 year for next physical.  Health Maintenance, Male Adopting a healthy lifestyle and getting preventive care are important in promoting health and wellness. Ask your health care provider about: The right schedule for you to have regular tests and exams. Things you can do on your own to prevent diseases and keep yourself healthy. What should I know about diet, weight, and exercise? Eat a healthy diet  Eat a diet that includes plenty of vegetables, fruits, low-fat dairy products, and lean protein. Do not eat a lot of foods that are high in solid fats, added sugars, or sodium. Maintain a healthy weight Body mass index (BMI) is a measurement that can be used to identify possible weight problems. It estimates body fat based on height and weight. Your health care provider can help determine your BMI and help you achieve or maintain a healthy weight. Get regular exercise Get regular exercise. This is one of the most important things you can do for your health. Most adults should: Exercise for at least 150 minutes each week. The exercise should increase your heart rate and make you sweat (moderate-intensity exercise). Do strengthening exercises at least twice a week. This is in addition to the moderate-intensity exercise. Spend less time sitting. Even light physical activity can be beneficial. Watch cholesterol and blood lipids Have your blood tested for lipids and cholesterol at 60 years of age, then have this test every 5 years. You may need to have your cholesterol levels checked more often if: Your lipid or cholesterol  levels are high. You are older than 60 years of age. You are at high risk for heart disease. What should I know about cancer screening? Many types of cancers can be detected early and may often be prevented. Depending on your health history and family history, you may need to have cancer screening at various ages. This may include screening for: Colorectal cancer. Prostate cancer. Skin cancer. Lung cancer. What should I know about heart disease, diabetes, and high blood pressure? Blood pressure and heart disease High blood pressure causes heart disease and increases the risk of stroke. This is more likely to develop in people who have high blood pressure readings or are overweight. Talk with your health care provider about your target blood pressure readings. Have your blood pressure checked: Every 3-5 years if you are 62-52 years of age. Every year if you are 36 years old or older. If you are between the ages of 11 and 36 and are a current or former smoker, ask your health care provider if you should have a one-time screening for abdominal aortic aneurysm (AAA). Diabetes Have regular diabetes screenings. This checks your fasting blood sugar level. Have the screening done: Once every three years after age 42 if you are at a normal weight and have a low risk for diabetes. More often and at a younger age if you are overweight or have a high risk for diabetes. What should I know about preventing infection? Hepatitis B If you have a higher risk for hepatitis B, you should be screened for this virus. Talk with your health care  provider to find out if you are at risk for hepatitis B infection. Hepatitis C Blood testing is recommended for: Everyone born from [birth year] through 1964. Anyone with known risk factors for hepatitis C. Sexually transmitted infections (STIs) You should be screened each year for STIs, including gonorrhea and chlamydia, if: You are sexually active and are younger than 60  years of age. You are older than 60 years of age and your health care provider tells you that you are at risk for this type of infection. Your sexual activity has changed since you were last screened, and you are at increased risk for chlamydia or gonorrhea. Ask your health care provider if you are at risk. Ask your health care provider about whether you are at high risk for HIV. Your health care provider may recommend a prescription medicine to help prevent HIV infection. If you choose to take medicine to prevent HIV, you should first get tested for HIV. You should then be tested every 3 months for as long as you are taking the medicine. Follow these instructions at home: Alcohol use Do not drink alcohol if your health care provider tells you not to drink. If you drink alcohol: Limit how much you have to 0-2 drinks a day. Know how much alcohol is in your drink. In the [place] one drink equals one 12 oz bottle of beer (355 mL), one 5 oz glass of wine (148 mL), or one 1 oz glass of hard liquor (44 mL). Lifestyle Do not use any products that contain nicotine or tobacco. These products include cigarettes, chewing tobacco, and vaping devices, such as e-cigarettes. If you need help quitting, ask your health care provider. Do not use street drugs. Do not share needles. Ask your health care provider for help if you need support or information about quitting drugs. General instructions Schedule regular health, dental, and eye exams. Stay current with your vaccines. Tell your health care provider if: You often feel depressed. You have ever been abused or do not feel safe at home. Summary Adopting a healthy lifestyle and getting preventive care are important in promoting health and wellness. Follow your health care provider's instructions about healthy diet, exercising, and getting tested or screened for diseases. Follow your health care provider's instructions on monitoring your cholesterol and blood  pressure. This information is not intended to replace advice given to you by your health care provider. Make sure you discuss any questions you have with your health care provider. Document Revised: 12/13/2020 Document Reviewed: 12/13/2020 Elsevier Patient Education  [place].

## 2021-12-27 NOTE — Assessment & Plan Note (Addendum)
Continue to encourage healthy diet and lifestyle. Regularly works out.

## 2021-12-27 NOTE — Assessment & Plan Note (Addendum)
Chronic, stable. Discussed water intake. Anticipate elevated Cr due to muscle mass.

## 2021-12-27 NOTE — Progress Notes (Signed)
Patient ID: Alejandro Decker, male    DOB: 1961-06-02, 61 y.o.   MRN: [id number]  This visit was conducted in person.  BP 130/78 (BP Location: Right Arm, Cuff Size: Large)   Pulse 75   Temp 98 F (36.7 C) (Temporal)   Ht '5\' 9"'$  (1.753 m)   Wt 249 lb 8 oz (113.2 kg)   SpO2 96%   BMI 36.84 kg/m   BP Readings from Last 3 Encounters:  12/27/21 130/78  08/22/21 (!) 150/90  08/17/21 120/74   CC: CPE Subjective:   HPI: Alejandro Decker is a 61 y.o. 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 Saw VVS and hematology in follow up - given extensive nature of PE, recommended lifelong anticoagulation. Hypercoagulable panel returned normal (normal FVL, PT gene, protein C or C&S, AT3, anti-phospholipid antibody). Repeat venous US showed resolution of RLE DVT. Has been recommended compression stockings as well.   CTA chest with contast IMPRESSION: Acute pulmonary embolism. Large embolic burden with enlargement the central pulmonary arteries in keeping with pulmonary arterial hypertension. No CT evidence of right heart strain. Rim calcified subcapsular lesion within the visualized upper pole the left kidney, incompletely imaged on this examination possibly representing a remote subcapsular hematoma or abscess. If indicated, this could be better assessed with dedicated CT or MRI imaging.   He states f/u imaging study was done at [place].  From phone note 02/2021: Called pt and discussed results of CT abdomen with/without contrast done 02/08/2021 at [facility]: L kidney rim calcified cyst 5x2x2cm most consistent with remote subcapsular hematoma, with elongated L kidney ?duplicated upper collecting system.  Sent report for STAT scanning No records of this in our system - will again request report.   COVID infection 03/2021 - fully resolved. Cervical DDD - saw Dr Lorelei Pont  treating with muscle relaxants with benefit however very sedating. Also completed course of PT with limited benefit.  Back is feeling well.   BP elevated today - no h/o hypertension. He has home cuff.   Preventative: Colon cancer screening - iFOB negative yearly - discussed colonoscopy this year given PE last year.  Prostate cancer screening - yearly PSA. No BPH symptom.  Lung cancer screening - not eligible  Flu yearly at work  COVID vaccine J&J 11/2019, Moderna booster 06/2020  Td 2010, Tdap 12/2020 Shingrix - 12/2020, 04/2021  Seat belt use discussed Sunscreen use discussed. No suspicious moles noted. Sees [place] derm, h/o porokeratosis 05/2018.  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 Interim medical history since our last visit reviewed. Allergies and medications reviewed and updated. Outpatient Medications Prior to Visit  Medication Sig Dispense Refill   acetaminophen (TYLENOL) 650 MG CR tablet Take 650 mg by mouth every 8 (eight) hours as needed for pain.     cetirizine (ZYRTEC) 10 MG tablet Take 10 mg by mouth daily as needed.     diphenhydrAMINE (BENADRYL) 25 mg capsule Take 50 mg by mouth at bedtime as needed.  tiZANidine (ZANAFLEX) 4 MG tablet TAKE 1 TABLET BY MOUTH AT BEDTIME AS NEEDED FOR MUSCLE SPASMS. 30 tablet 1   apixaban (ELIQUIS) 5 MG TABS tablet TAKE 1 TABLET BY MOUTH TWICE A DAY 60 tablet 6   predniSONE (DELTASONE) 20 MG tablet 2 tabs po daily for 5 days, then 1 tab po daily for 5 days 15 tablet 0   No facility-administered medications prior to visit.     Per HPI unless specifically indicated in ROS  section below Review of Systems  Constitutional:  Negative for activity change, appetite change, chills, fatigue, fever and unexpected weight change.  HENT:  Negative for hearing loss.   Eyes:  Negative for visual disturbance.  Respiratory:  Negative for cough, chest tightness, shortness of breath and wheezing.   Cardiovascular:  Negative for chest pain, palpitations and leg swelling.  Gastrointestinal:  Negative for abdominal distention, abdominal pain, blood in stool, constipation, diarrhea, nausea and vomiting.  Genitourinary:  Negative for difficulty urinating and hematuria.  Musculoskeletal:  Negative for arthralgias, myalgias and neck pain.  Skin:  Negative for rash.  Neurological:  Negative for dizziness, seizures, syncope and headaches.  Hematological:  Negative for adenopathy. Does not bruise/bleed easily.  Psychiatric/Behavioral:  Negative for dysphoric mood. The patient is not nervous/anxious.    Objective:  BP 130/78 (BP Location: Right Arm, Cuff Size: Large)   Pulse 75   Temp 98 F (36.7 C) (Temporal)   Ht '5\' 9"'$  (1.753 m)   Wt 249 lb 8 oz (113.2 kg)   SpO2 96%   BMI 36.84 kg/m   Wt Readings from Last 3 Encounters:  12/27/21 249 lb 8 oz (113.2 kg)  08/22/21 251 lb 6.4 oz (114 kg)  08/17/21 252 lb 2 oz (114.4 kg)      Physical Exam Vitals and nursing note reviewed.  Constitutional:      General: He is not in acute distress.    Appearance: Normal appearance. He is well-developed. He is not ill-appearing.  HENT:     Head: Normocephalic and atraumatic.     Right Ear: Hearing, tympanic membrane, ear canal and external ear normal.     Left Ear: Hearing, tympanic membrane, ear canal and external ear normal.  Eyes:     General: No scleral icterus.    Extraocular Movements: Extraocular movements intact.     Conjunctiva/sclera: Conjunctivae normal.     Pupils: Pupils are equal, round, and reactive to light.  Neck:     Thyroid: No thyroid mass or thyromegaly.   Cardiovascular:     Rate and Rhythm: Normal rate and regular rhythm.     Pulses: Normal pulses.          Radial pulses are 2+ on the right side and 2+ on the left side.     Heart sounds: Normal heart sounds. No murmur heard. Pulmonary:     Effort: Pulmonary effort is normal. No respiratory distress.     Breath sounds: Normal breath sounds. No wheezing, rhonchi or rales.  Abdominal:     General: Bowel sounds are normal. There is no distension.     Palpations: Abdomen is soft. There is no mass.     Tenderness: There is no abdominal tenderness. There is no guarding or rebound.     Hernia: No hernia is present.  Musculoskeletal:        General: Normal range of motion.     Cervical back: Normal range of motion and neck supple.     Right lower  leg: No edema.     Left lower leg: No edema.  Lymphadenopathy:     Cervical: No cervical adenopathy.  Skin:    General: Skin is warm and dry.     Findings: No rash.  Neurological:     General: No focal deficit present.     Mental Status: He is alert and oriented to person, place, and time.  Psychiatric:        Mood and Affect: Mood normal.        Behavior: Behavior normal.        Thought Content: Thought content normal.        Judgment: Judgment normal.      Results for orders placed or performed in visit on 12/20/21  VITAMIN D 25 Hydroxy (Vit-D Deficiency, Fractures)  Result Value Ref Range   VITD 27.48 (L) 30.00 - 100.00 ng/mL  Urinalysis, Routine w reflex microscopic  Result Value Ref Range   Color, Urine YELLOW Yellow;Lt. Yellow;Straw;Dark Yellow;Amber;Green;Red;Brown   APPearance CLEAR Clear;Turbid;Slightly Cloudy;Cloudy   Specific Gravity, Urine 1.025 1.000 - 1.030   pH 6.0 5.0 - 8.0   Total Protein, Urine NEGATIVE Negative   Urine Glucose NEGATIVE Negative   Ketones, ur NEGATIVE Negative   Bilirubin Urine NEGATIVE Negative   Hgb urine dipstick NEGATIVE Negative   Urobilinogen, UA 0.2 0.0 - 1.0   Leukocytes,Ua NEGATIVE  Negative   Nitrite NEGATIVE Negative   WBC, UA 0-2/hpf 0-2/hpf   RBC / HPF 0-2/hpf 0-2/hpf   Mucus, UA Presence of (A) None   Squamous Epithelial / LPF Rare(0-4/hpf) Rare(0-4/hpf)  CBC with Differential/Platelet  Result Value Ref Range   WBC 5.4 4.0 - 10.5 K/uL   RBC 4.63 4.22 - 5.81 Mil/uL   Hemoglobin 14.7 13.0 - 17.0 g/dL   HCT 43.3 39.0 - 52.0 %   MCV 93.6 78.0 - 100.0 fl   MCHC 33.8 30.0 - 36.0 g/dL   RDW 13.0 11.5 - 15.5 %   Platelets 227.0 150.0 - 400.0 K/uL   Neutrophils Relative % 52.2 43.0 - 77.0 %   Lymphocytes Relative 28.5 12.0 - 46.0 %   Monocytes Relative 7.6 3.0 - 12.0 %   Eosinophils Relative 10.9 (H) 0.0 - 5.0 %   Basophils Relative 0.8 0.0 - 3.0 %   Neutro Abs 2.8 1.4 - 7.7 K/uL   Lymphs Abs 1.5 0.7 - 4.0 K/uL   Monocytes Absolute 0.4 0.1 - 1.0 K/uL   Eosinophils Absolute 0.6 0.0 - 0.7 K/uL   Basophils Absolute 0.0 0.0 - 0.1 K/uL  Comprehensive metabolic panel  Result Value Ref Range   Sodium 140 135 - 145 mEq/L   Potassium 4.4 3.5 - 5.1 mEq/L   Chloride 104 96 - 112 mEq/L   CO2 26 19 - 32 mEq/L   Glucose, Bld 98 70 - 99 mg/dL   BUN 25 (H) 6 - 23 mg/dL   Creatinine, Ser 1.48 0.40 - 1.50 mg/dL   Total Bilirubin 0.6 0.2 - 1.2 mg/dL   Alkaline Phosphatase 65 39 - 117 U/L   AST 26 0 - 37 U/L   ALT 26 0 - 53 U/L   Total Protein 6.7 6.0 - 8.3 g/dL   Albumin 4.3 3.5 - 5.2 g/dL   GFR 51.39 (L) >60.00 mL/min   Calcium 9.4 8.4 - 10.5 mg/dL  Lipid panel  Result Value Ref Range   Cholesterol 201 (H) 0 - 200 mg/dL   Triglycerides 69.0 0.0 - 149.0 mg/dL   HDL 69.10 >39.00  mg/dL   VLDL 13.8 0.0 - 40.0 mg/dL   LDL Cholesterol 118 (H) 0 - 99 mg/dL   Total CHOL/HDL Ratio 3    NonHDL 132.25    Lab Results  Component Value Date   PSA 0.61 12/13/2020   PSA 0.50 10/10/2019   PSA 0.72 10/09/2018   Assessment & Plan:   Problem List Items Addressed This Visit     Healthcare maintenance - Primary (Chronic)    Preventative protocols reviewed and updated unless  pt declined. Discussed healthy diet and lifestyle. We will refer for colonoscopy given recent PE. PSA not done this year-will need updated next labs.       HLD (hyperlipidemia)    Mild, remains off medication. Statin previously caused transaminitis, saw cardiology who recommended treatment if LDL >130.  The 10-year ASCVD risk score (Arnett DK, et al., 2019) is: 6.4%   Values used to calculate the score:     Age: 89 years     Sex: Male     Is Non-Hispanic African American: No     Diabetic: No     Tobacco smoker: No     Systolic Blood Pressure: 353 mmHg     Is BP treated: No     HDL Cholesterol: 69.1 mg/dL     Total Cholesterol: 201 mg/dL        Relevant Medications   apixaban (ELIQUIS) 5 MG TABS tablet   Severe obesity (BMI 35.0-39.9) with comorbidity ([place])    Continue to encourage healthy diet and lifestyle. Regularly works out.        Renal insufficiency    Chronic, stable. Discussed water intake. Anticipate elevated Cr due to muscle mass.       History of pulmonary embolism    H/o RLE DVT with extensive bilateral PE s/p thrombolysis and mechanical thrombectomy now on lifelong Eliquis. Saw oncology Janese Banks), completed reassuringly negative hypercoagulability panel.       Lesion of left native kidney    Reviewed CT report from [place] dated July 2022 from care everywhere consistent with benign remote subcapsular hematoma       DDD (degenerative disc disease), cervical    Saw sports medicine appreciate his care.  He has underwent physical therapy for this.  Benefits from muscle relaxant use although it is sedating.  Discussed dosing of Zanaflex.       Relevant Medications   acetaminophen (TYLENOL) 650 MG CR tablet   Vitamin D deficiency    Recommend restart vitamin D 1000 units daily       Elevated blood pressure reading in office without diagnosis of hypertension    This normalizes on recheck.  I did ask him to keep track of blood pressures at home and let me  know if consistently elevated.  We also discussed salt restriction, water intake, potassium rich diet.       Other Visit Diagnoses     Special screening for malignant neoplasms, colon       Relevant Orders   Ambulatory referral to Gastroenterology        Meds ordered this encounter  Medications   apixaban (ELIQUIS) 5 MG TABS tablet    Sig: Take 1 tablet (5 mg total) by mouth 2 (two) times daily.    Dispense:  180 tablet    Refill:  3   Cholecalciferol (VITAMIN D3) 25 MCG (1000 UT) CAPS    Sig: Take 1 capsule (1,000 Units total) by mouth daily.    Dispense:  30 capsule  Orders Placed This Encounter  Procedures   Ambulatory referral to Gastroenterology    Referral Priority:   Routine    Referral Type:   Consultation    Referral Reason:   Specialty Services Required    Number of Visits Requested:   1    Patient instructions: Sign release of information for imaging study done at [facility] last year.  We will call you to set up a colonoscopy.  Start monitoring blood pressures at home, let me know if consistently >140/90.  Ensure good water intake, limit salt/sodium in the diet, ensure good fruits/vegetable intake. Increase water by 1-2 8oz cups/day.  You are doing well today Return as needed or in 1 year for next physical.  Follow up plan: Return in about 1 year (around 12/28/2022) for annual exam, prior fasting for blood work.  Ria Bush, MD

## 2021-12-27 NOTE — Assessment & Plan Note (Addendum)
Saw sports medicine appreciate his care.  He has underwent physical therapy for this.  Benefits from muscle relaxant use although it is sedating.  Discussed dosing of Zanaflex.

## 2021-12-27 NOTE — Assessment & Plan Note (Addendum)
Preventative protocols reviewed and updated unless pt declined. Discussed healthy diet and lifestyle. We will refer for colonoscopy given recent PE. PSA not done this year-will need updated next labs.

## 2021-12-27 NOTE — Assessment & Plan Note (Signed)
Mild, remains off medication. Statin previously caused transaminitis, saw cardiology who recommended treatment if LDL >130.  The 10-year ASCVD risk score (Arnett DK, et al., 2019) is: 6.4%   Values used to calculate the score:     Age: 61 years     Sex: Male     Is Non-Hispanic African American: No     Diabetic: No     Tobacco smoker: No     Systolic Blood Pressure: 451 mmHg     Is BP treated: No     HDL Cholesterol: 69.1 mg/dL     Total Cholesterol: 201 mg/dL

## 2021-12-27 NOTE — Assessment & Plan Note (Signed)
This normalizes on recheck.  I did ask him to keep track of blood pressures at home and let me know if consistently elevated.  We also discussed salt restriction, water intake, potassium rich diet.

## 2021-12-27 NOTE — Assessment & Plan Note (Signed)
H/o RLE DVT with extensive bilateral PE s/p thrombolysis and mechanical thrombectomy now on lifelong Eliquis. Saw oncology Janese Banks), completed reassuringly negative hypercoagulability panel.

## 2021-12-27 NOTE — Assessment & Plan Note (Signed)
Recommend restart vitamin D 1000 units daily

## 2021-12-27 NOTE — Assessment & Plan Note (Signed)
Reviewed CT report from [place] dated July 2022 from care everywhere consistent with benign remote subcapsular hematoma

## 2021-12-28 ENCOUNTER — Other Ambulatory Visit: Payer: Self-pay

## 2021-12-28 ENCOUNTER — Telehealth: Payer: Self-pay

## 2021-12-28 DIAGNOSIS — Z1211 Encounter for screening for malignant neoplasm of colon: Secondary | ICD-10-CM

## 2021-12-28 MED ORDER — GOLYTELY 236 G PO SOLR: 4000.0000 mL | Freq: Once | ORAL | 0 refills | Status: AC

## 2021-12-28 NOTE — Telephone Encounter (Signed)
Gastroenterology Pre-Procedure Review  Request Date: 01/18/22 Requesting Physician: Dr. Vicente Males  PATIENT REVIEW QUESTIONS: The patient responded to the following health history questions as indicated:    1. Are you having any GI issues? no 2. Do you have a personal history of Polyps? no 3. Do you have a family history of Colon Cancer or Polyps? no 4. Diabetes Mellitus? no 5. Joint replacements in the past 12 months?no 6. Major health problems in the past 3 months? Pulmonary Embolism June 2022 7. Any artificial heart valves, MVP, or defibrillator?no    MEDICATIONS & ALLERGIES:    Patient reports the following regarding taking any anticoagulation/antiplatelet therapy:   Plavix, Coumadin, Eliquis, Xarelto, Lovenox, Pradaxa, Brilinta, or Effient? Eliquis prescribed by Dr.Gutierrez blood thinner has been sent via Epic Aspirin? no  Patient confirms/reports the following medications:  Current Outpatient Medications  Medication Sig Dispense Refill   acetaminophen (TYLENOL) 650 MG CR tablet Take 650 mg by mouth every 8 (eight) hours as needed for pain.     apixaban (ELIQUIS) 5 MG TABS tablet Take 1 tablet (5 mg total) by mouth 2 (two) times daily. 180 tablet 3   cetirizine (ZYRTEC) 10 MG tablet Take 10 mg by mouth daily as needed.     Cholecalciferol (VITAMIN D3) 25 MCG (1000 UT) CAPS Take 1 capsule (1,000 Units total) by mouth daily. 30 capsule    diphenhydrAMINE (BENADRYL) 25 mg capsule Take 50 mg by mouth at bedtime as needed.     tiZANidine (ZANAFLEX) 4 MG tablet TAKE 1 TABLET BY MOUTH AT BEDTIME AS NEEDED FOR MUSCLE SPASMS. 30 tablet 1   No current facility-administered medications for this visit.    Patient confirms/reports the following allergies:  No Known Allergies  No orders of the defined types were placed in this encounter.   AUTHORIZATION INFORMATION Primary Insurance: 1D#: Group #:  Secondary Insurance: 1D#: Group #:  SCHEDULE INFORMATION: Date:  01/18/22 Time: Location: [facility]

## 2022-01-10 ENCOUNTER — Telehealth: Payer: Self-pay | Admitting: Family Medicine

## 2022-01-10 ENCOUNTER — Telehealth: Payer: Self-pay

## 2022-01-10 NOTE — Telephone Encounter (Signed)
LVM advising patient per Dr. Danise Mina to hold Eliquis 2 days prior to colonoscopy.  Resume the day after.  Thanks, [place], [place]

## 2022-01-10 NOTE — Telephone Encounter (Signed)
Received request from [facility] for anticoagulant management around upcoming surgical procedure: colonoscopy 01/18/2022.   Current blood thinner: Eliquis '5mg'$  BID Indication for anticoagulation: pulmonary embolism with DVT early 2022 Bleeding risk of procedure: low  Recommend: ok to hold eliquis 2 days prior to colonoscopy, restart day after colonoscopy or as soon as recommended by GI.

## 2022-01-17 ENCOUNTER — Encounter: Payer: Self-pay | Admitting: Gastroenterology

## 2022-01-18 ENCOUNTER — Ambulatory Visit: Payer: BC Managed Care – PPO | Admitting: Anesthesiology

## 2022-01-18 ENCOUNTER — Encounter
Admission: RE | Disposition: A | Discharge status: Home / Self Care | Payer: Self-pay | Source: Home / Self Care | Attending: Gastroenterology

## 2022-01-18 ENCOUNTER — Encounter: Payer: Self-pay | Admitting: Gastroenterology

## 2022-01-18 ENCOUNTER — Ambulatory Visit
Admission: RE | Admit: 2022-01-18 | Discharge: 2022-01-18 | Disposition: A | Discharge status: Home / Self Care | Payer: BC Managed Care – PPO | Attending: Gastroenterology | Admitting: Gastroenterology

## 2022-01-18 ENCOUNTER — Other Ambulatory Visit: Payer: Self-pay

## 2022-01-18 DIAGNOSIS — N189 Chronic kidney disease, unspecified: Secondary | ICD-10-CM | POA: Insufficient documentation

## 2022-01-18 DIAGNOSIS — K635 Polyp of colon: Secondary | ICD-10-CM | POA: Diagnosis not present

## 2022-01-18 DIAGNOSIS — D122 Benign neoplasm of ascending colon: Secondary | ICD-10-CM | POA: Insufficient documentation

## 2022-01-18 DIAGNOSIS — Z1211 Encounter for screening for malignant neoplasm of colon: Secondary | ICD-10-CM | POA: Diagnosis not present

## 2022-01-18 DIAGNOSIS — D126 Benign neoplasm of colon, unspecified: Secondary | ICD-10-CM | POA: Diagnosis not present

## 2022-01-18 DIAGNOSIS — I4519 Other right bundle-branch block: Secondary | ICD-10-CM | POA: Diagnosis not present

## 2022-01-18 DIAGNOSIS — K5730 Diverticulosis of large intestine without perforation or abscess without bleeding: Secondary | ICD-10-CM | POA: Insufficient documentation

## 2022-01-18 HISTORY — PX: COLONOSCOPY WITH PROPOFOL: SHX5780

## 2022-01-18 HISTORY — DX: Vitamin D deficiency, unspecified: E55.9

## 2022-01-18 HISTORY — DX: Hyperlipidemia, unspecified: E78.5

## 2022-01-18 HISTORY — DX: Other intervertebral disc degeneration, lumbar region: M51.36

## 2022-01-18 HISTORY — DX: Other intervertebral disc degeneration, lumbar region without mention of lumbar back pain or lower extremity pain: M51.369

## 2022-01-18 HISTORY — DX: Obesity, unspecified: E66.9

## 2022-01-18 SURGERY — COLONOSCOPY WITH PROPOFOL
Anesthesia: General

## 2022-01-18 MED ORDER — SODIUM CHLORIDE 0.9 % IV SOLN: INTRAVENOUS | Status: DC

## 2022-01-18 MED ORDER — PROPOFOL 1000 MG/100ML IV EMUL
INTRAVENOUS | Status: AC
  Filled 2022-01-18: qty 100

## 2022-01-18 MED ORDER — PROPOFOL 500 MG/50ML IV EMUL
INTRAVENOUS | Status: DC | PRN
  Administered 2022-01-18: 150 ug/kg/min via INTRAVENOUS

## 2022-01-18 NOTE — Anesthesia Procedure Notes (Signed)
Date/Time: 01/18/2022 9:30 AM  Performed by: Donalda Ewings, CindyPre-anesthesia Checklist: Patient identified, Emergency Drugs available, Suction available and Patient being monitored Patient Re-evaluated:Patient Re-evaluated prior to induction Oxygen Delivery Method: Simple face mask Preoxygenation: Pre-oxygenation with 100% oxygen Induction Type: IV induction Placement Confirmation: positive ETCO2 and CO2 detector

## 2022-01-18 NOTE — H&P (Signed)
Alejandro Bellows, MD [address] [place], [place], [place], [address] [address], [place], [place], [place], [address] Phone: [phone]  Fax: [phone]  Primary Care Physician:  Ria Bush, MD   Pre-Procedure History & Physical: HPI:  Alejandro Decker is a 61 y.o. male is here for an colonoscopy.   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 Gaspar Bidding, [place], [facility]   PULMONARY THROMBECTOMY Bilateral 01/2021   TPA thrombolysis and thrombectomy for bilateral PE with R heart strain and hypoxia [name])   PULMONARY THROMBECTOMY Bilateral 01/12/2021   Procedure: PULMONARY THROMBECTOMY;  Surgeon: Katha Cabal, MD;  Location: [place] [facility];  Service: Cardiovascular;  Laterality: Bilateral;   TONSILLECTOMY  7 yoa   US ECHOCARDIOGRAPHY  05/2015   mild concentric LVH, EF 66%, mildly dilated aortic root 4.1cm    Prior to Admission medications   Medication Sig Start Date End Date Taking? Authorizing Provider  acetaminophen (TYLENOL) 650 MG CR tablet Take 650 mg by mouth every 8 (eight) hours as needed for pain.   Yes [provider]  apixaban (ELIQUIS) 5 MG TABS tablet Take 1 tablet (5 mg total) by mouth 2 (two) times daily. 12/27/21  Yes Ria Bush, MD  cetirizine (ZYRTEC) 10 MG tablet Take 10 mg by mouth daily as needed.   Yes [provider]  Cholecalciferol (VITAMIN D3) 25 MCG (1000 UT) CAPS Take 1 capsule  (1,000 Units total) by mouth daily. 12/27/21  Yes Ria Bush, MD  diphenhydrAMINE (BENADRYL) 25 mg capsule Take 50 mg by mouth at bedtime as needed.   Yes [provider]  tiZANidine (ZANAFLEX) 4 MG tablet TAKE 1 TABLET BY MOUTH AT BEDTIME AS NEEDED FOR MUSCLE SPASMS. 12/03/21  Yes Copland, Frederico Hamman, MD    Allergies as of 12/28/2021   (No Known Allergies)    Family History  Problem Relation Age of Onset   Benign prostatic hyperplasia Father    Diabetes Maternal Grandfather    Heart failure Maternal Grandfather    Parkinsonism Paternal Grandmother    Stroke Paternal Grandfather    Coronary artery disease Neg Hx    Cancer Neg Hx     Social History   Socioeconomic History   Marital status: Married    Spouse name: Not on file   Number of children: 2   Years of education: Not on file   Highest education level: Not on file  Occupational History   Occupation: Chief Financial Officer, [employer]    Employer: [employer]    Comment: [employer]  Tobacco Use   Smoking status: Never   Smokeless tobacco: Never  Vaping Use   Vaping Use: Never used  Substance and Sexual Activity   Alcohol use: Yes    Alcohol/week: 6.0 standard drinks of alcohol    Types: 6 drink(s) per week  Comment: weekly   Drug use: No   Sexual activity: Yes  Other Topics Concern   Not on file  Social History Narrative   Caffeine: 4-5 cups coffee/day   Lives with wife and 2 daughters, 3 dogs   Occupation: Freight forwarder at [employer]   Activity: works out 6d/wk, cardio and [employer]   Diet: some water, fruits/vegetables daily, no fast food   Social Determinants of Radio broadcast assistant Strain: Not on file  Food Insecurity: Not on file  Transportation Needs: Not on file  Physical Activity: Not on file  Stress: Not on file  Social Connections: Not on file  Intimate Partner Violence: Not on file    Review of Systems: See HPI, otherwise negative ROS  Physical Exam: BP (!) 158/98    Pulse (!) 54   Temp 97.6 F (36.4 C) (Temporal)   Resp 16   Ht '5\' 10"'$  (1.778 m)   Wt 111.1 kg   SpO2 100%   BMI 35.15 kg/m  General:   Alert,  pleasant and cooperative in NAD Head:  Normocephalic and atraumatic. Neck:  Supple; no masses or thyromegaly. Lungs:  Clear throughout to auscultation, normal respiratory effort.    Heart:  +S1, +S2, Regular rate and rhythm, No edema. Abdomen:  Soft, nontender and nondistended. Normal bowel sounds, without guarding, and without rebound.   Neurologic:  Alert and  oriented x4;  grossly normal neurologically.  Impression/Plan: Alejandro Decker is here for an colonoscopy to be performed for Screening colonoscopy average risk   Risks, benefits, limitations, and alternatives regarding  colonoscopy have been reviewed with the patient.  Questions have been answered.  All parties agreeable.   Alejandro Bellows, MD  01/18/2022, 9:15 AM

## 2022-01-18 NOTE — Transfer of Care (Signed)
Immediate Anesthesia Transfer of Care Note  Patient: Alejandro Decker  Procedure(s) Performed: COLONOSCOPY WITH PROPOFOL  Patient Location: PACU  Anesthesia Type:General  Level of Consciousness: awake and sedated  Airway & Oxygen Therapy: Patient Spontanous Breathing and Patient connected to nasal cannula oxygen  Post-op Assessment: Report given to RN and Post -op Vital signs reviewed and stable  Post vital signs: Reviewed and stable  Last Vitals:  Vitals Value Taken Time  BP    Temp    Pulse    Resp    SpO2      Last Pain:  Vitals:   01/18/22 0904  TempSrc: Temporal  PainSc: 0-No pain         Complications: No notable events documented.

## 2022-01-18 NOTE — Anesthesia Preprocedure Evaluation (Addendum)
Anesthesia Evaluation  Patient identified by MRN, date of birth, ID band Patient awake    Reviewed: Allergy & Precautions, NPO status , Patient's Chart, lab work & pertinent test results  Airway Mallampati: II  TM Distance: >3 FB Neck ROM: full    Dental no notable dental hx.    Pulmonary neg pulmonary ROS,  H/o RLE DVT + extensive bilateral PE (eliquis) 2022 s/p pulmonary thrombectomy-  negative hypercoagulability panel   Pulmonary exam normal        Cardiovascular Exercise Tolerance: Good Normal cardiovascular exam+ dysrhythmias (incomplete RBBB)      Neuro/Psych negative neurological ROS  negative psych ROS   GI/Hepatic Neg liver ROS, GERD  Controlled,  Endo/Other  negative endocrine ROS  Renal/GU CRFRenal disease  negative genitourinary   Musculoskeletal  (+) Arthritis ,   Abdominal (+) + obese,   Peds  Hematology negative hematology ROS (+)   Anesthesia Other Findings Past Medical History: No date: DDD (degenerative disc disease), lumbar No date: GERD (gastroesophageal reflux disease) No date: HLD (hyperlipidemia) 05/08/2015: Incomplete RBBB No date: Obesity (BMI 35.0-39.9 without comorbidity) 01/2021: Venous thromboembolism (VTE)     Comment:  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 Gaspar Bidding, [place], [facility] 01/2021: PULMONARY THROMBECTOMY; Bilateral     Comment:  TPA thrombolysis and thrombectomy for bilateral PE with               R heart strain and hypoxia [name]) 01/12/2021: PULMONARY THROMBECTOMY; Bilateral     Comment:  Procedure: PULMONARY THROMBECTOMY;  Surgeon: Katha Cabal, MD;  Location: [facility];  Service:              Cardiovascular;  Laterality: Bilateral; 6 yoa: TONSILLECTOMY 05/2015: US ECHOCARDIOGRAPHY     Comment:  mild concentric LVH, EF 66%, mildly dilated aortic root               4.1cm     Reproductive/Obstetrics negative OB ROS                            Anesthesia Physical Anesthesia Plan  ASA: 2  Anesthesia Plan: General   Post-op Pain Management:    Induction: Intravenous  PONV Risk Score and Plan: Propofol infusion and TIVA  Airway Management Planned: Natural Airway  Additional Equipment:   Intra-op Plan:   Post-operative Plan:   Informed Consent: I have reviewed the patients History and Physical, chart, labs and discussed the procedure including the risks, benefits and alternatives for the proposed anesthesia with the patient or authorized representative who has indicated his/her understanding and acceptance.     Dental Advisory Given  Plan Discussed with: Anesthesiologist, CRNA and Surgeon  Anesthesia Plan Comments:        Anesthesia Quick Evaluation

## 2022-01-18 NOTE — Op Note (Signed)
[facility] Gastroenterology Patient Name: Alejandro Decker Procedure Date: 01/18/2022 9:12 AM MRN: [id number] Account #: [id number] Date of Birth: 07-26-1961 Admit Type: Outpatient Age: 61 Room: [facility] ENDO ROOM 3 Gender: Male Note Status: Finalized Instrument Name: Jasper Riling [id number] Procedure:             Colonoscopy Indications:           Screening for colorectal malignant neoplasm Providers:             Jonathon Bellows MD, MD Medicines:             Monitored Anesthesia Care Complications:         No immediate complications. Procedure:             Pre-Anesthesia Assessment:                        - Prior to the procedure, a History and Physical was                         performed, and patient medications, allergies and                         sensitivities were reviewed. The patient's tolerance                         of previous anesthesia was reviewed.                        - The risks and benefits of the procedure and the                         sedation options and risks were discussed with the                         patient. All questions were answered and informed                         consent was obtained.                        - ASA Grade Assessment: II - A patient with mild                         systemic disease.                        After obtaining informed consent, the colonoscope was                         passed under direct vision. Throughout the procedure,                         the patient's blood pressure, pulse, and oxygen                         saturations were monitored continuously. The                         Colonoscope was introduced through the anus and  advanced to the the cecum, identified by the                         appendiceal orifice. The colonoscopy was performed                         with ease. The patient tolerated the procedure well.                         The quality of the bowel preparation  was excellent. Findings:      The perianal and digital rectal examinations were normal.      Two sessile polyps were found in the cecum. The polyps were 4 to 5 mm in       size. These polyps were removed with a cold snare. Resection and       retrieval were complete.      A 5 mm polyp was found in the ascending colon. The polyp was sessile.       The polyp was removed with a cold snare. Resection and retrieval were       complete.      Multiple small-mouthed diverticula were found in the left colon.      The exam was otherwise without abnormality on direct and retroflexion       views. Impression:            - Two 4 to 5 mm polyps in the cecum, removed with a                         cold snare. Resected and retrieved.                        - One 5 mm polyp in the ascending colon, removed with                         a cold snare. Resected and retrieved.                        - Diverticulosis in the left colon.                        - The examination was otherwise normal on direct and                         retroflexion views. Recommendation:        - Discharge patient to home (with escort).                        - Advance diet as tolerated.                        - Continue present medications.                        - Await pathology results.                        - Repeat colonoscopy for surveillance based on  pathology results. Procedure Code(s):     --- Professional ---                        [phone], Colonoscopy, flexible; with removal of                         tumor(s), polyp(s), or other lesion(s) by snare                         technique Diagnosis Code(s):     --- Professional ---                        K63.5, Polyp of colon                        Z12.11, Encounter for screening for malignant neoplasm                         of colon                        K57.30, Diverticulosis of large intestine without                         perforation or  abscess without bleeding CPT copyright 2019 American Medical Association. All rights reserved. The codes documented in this report are preliminary and upon coder review may  be revised to meet current compliance requirements. Jonathon Bellows, MD Jonathon Bellows MD, MD 01/18/2022 9:46:20 AM This report has been signed electronically. Number of Addenda: 0 Note Initiated On: 01/18/2022 9:12 AM Scope Withdrawal Time: 0 hours 12 minutes 28 seconds  Total Procedure Duration: 0 hours 15 minutes 40 seconds  Estimated Blood Loss:  Estimated blood loss: none.      [facility]

## 2022-01-19 ENCOUNTER — Encounter: Payer: Self-pay | Admitting: Gastroenterology

## 2022-01-19 LAB — SURGICAL PATHOLOGY

## 2022-01-19 NOTE — Anesthesia Postprocedure Evaluation (Signed)
Anesthesia Post Note  Patient: Alejandro Decker  Procedure(s) Performed: COLONOSCOPY WITH PROPOFOL  Patient location during evaluation: Endoscopy Anesthesia Type: General Level of consciousness: awake and alert Pain management: pain level controlled Vital Signs Assessment: post-procedure vital signs reviewed and stable Respiratory status: spontaneous breathing, nonlabored ventilation and respiratory function stable Cardiovascular status: blood pressure returned to baseline and stable Postop Assessment: no apparent nausea or vomiting Anesthetic complications: no   No notable events documented.   Last Vitals:  Vitals:   01/18/22 1005 01/18/22 1030  BP: (!) 148/75 (!) 145/82  Pulse: 70   Resp: 16   Temp: (!) 36 C   SpO2: 96%     Last Pain:  Vitals:   01/19/22 0757  TempSrc:   PainSc: 0-No pain                 [place] Ouch

## 2022-01-22 ENCOUNTER — Encounter: Payer: Self-pay | Admitting: Family Medicine

## 2022-02-04 ENCOUNTER — Other Ambulatory Visit: Payer: Self-pay | Admitting: Family Medicine

## 2022-02-04 DIAGNOSIS — G8929 Other chronic pain: Secondary | ICD-10-CM

## 2022-02-04 DIAGNOSIS — M5030 Other cervical disc degeneration, unspecified cervical region: Secondary | ICD-10-CM

## 2022-02-05 NOTE — Telephone Encounter (Signed)
Last office visit 12/27/21 for CPE.  Last refilled 12/03/21 for #30 with 1 refill by Dr. Lorelei Pont.  CPE scheduled 12/29/22.  Ok to refill?

## 2022-02-25 ENCOUNTER — Emergency Department
Admission: EM | Admit: 2022-02-25 | Discharge: 2022-02-25 | Disposition: A | Discharge status: Home / Self Care | Payer: BC Managed Care – PPO | Attending: Emergency Medicine | Admitting: Emergency Medicine

## 2022-02-25 ENCOUNTER — Emergency Department: Payer: BC Managed Care – PPO

## 2022-02-25 ENCOUNTER — Other Ambulatory Visit: Payer: Self-pay

## 2022-02-25 DIAGNOSIS — M40204 Unspecified kyphosis, thoracic region: Secondary | ICD-10-CM | POA: Diagnosis not present

## 2022-02-25 DIAGNOSIS — Z7901 Long term (current) use of anticoagulants: Secondary | ICD-10-CM | POA: Diagnosis not present

## 2022-02-25 DIAGNOSIS — R944 Abnormal results of kidney function studies: Secondary | ICD-10-CM | POA: Insufficient documentation

## 2022-02-25 DIAGNOSIS — M546 Pain in thoracic spine: Secondary | ICD-10-CM | POA: Diagnosis not present

## 2022-02-25 DIAGNOSIS — M45A4 Non-radiographic axial spondyloarthritis of thoracic region: Secondary | ICD-10-CM | POA: Diagnosis not present

## 2022-02-25 DIAGNOSIS — M2578 Osteophyte, vertebrae: Secondary | ICD-10-CM | POA: Diagnosis not present

## 2022-02-25 DIAGNOSIS — M545 Low back pain, unspecified: Secondary | ICD-10-CM | POA: Diagnosis not present

## 2022-02-25 DIAGNOSIS — R9431 Abnormal electrocardiogram [ECG] [EKG]: Secondary | ICD-10-CM | POA: Diagnosis not present

## 2022-02-25 DIAGNOSIS — M45A6 Non-radiographic axial spondyloarthritis of lumbar region: Secondary | ICD-10-CM | POA: Diagnosis not present

## 2022-02-25 DIAGNOSIS — M5137 Other intervertebral disc degeneration, lumbosacral region: Secondary | ICD-10-CM | POA: Diagnosis not present

## 2022-02-25 DIAGNOSIS — I2510 Atherosclerotic heart disease of native coronary artery without angina pectoris: Secondary | ICD-10-CM | POA: Diagnosis not present

## 2022-02-25 DIAGNOSIS — M19012 Primary osteoarthritis, left shoulder: Secondary | ICD-10-CM | POA: Diagnosis not present

## 2022-02-25 DIAGNOSIS — R0602 Shortness of breath: Secondary | ICD-10-CM | POA: Insufficient documentation

## 2022-02-25 DIAGNOSIS — N2889 Other specified disorders of kidney and ureter: Secondary | ICD-10-CM | POA: Diagnosis not present

## 2022-02-25 DIAGNOSIS — M47819 Spondylosis without myelopathy or radiculopathy, site unspecified: Secondary | ICD-10-CM

## 2022-02-25 DIAGNOSIS — I712 Thoracic aortic aneurysm, without rupture, unspecified: Secondary | ICD-10-CM | POA: Diagnosis not present

## 2022-02-25 LAB — COMPREHENSIVE METABOLIC PANEL
ALT: 30 U/L (ref 0–44)
AST: 28 U/L (ref 15–41)
Albumin: 4.3 g/dL (ref 3.5–5.0)
Alkaline Phosphatase: 84 U/L (ref 38–126)
Anion gap: 8 (ref 5–15)
BUN: 27 mg/dL — ABNORMAL HIGH (ref 6–20)
CO2: 28 mmol/L (ref 22–32)
Calcium: 9.3 mg/dL (ref 8.9–10.3)
Chloride: 103 mmol/L (ref 98–111)
Creatinine, Ser: 1.7 mg/dL — ABNORMAL HIGH (ref 0.61–1.24)
GFR, Estimated: 46 mL/min — ABNORMAL LOW (ref >60)
Glucose, Bld: 120 mg/dL — ABNORMAL HIGH (ref 70–99)
Potassium: 3.9 mmol/L (ref 3.5–5.1)
Sodium: 139 mmol/L (ref 135–145)
Total Bilirubin: 0.9 mg/dL (ref 0.3–1.2)
Total Protein: 7.8 g/dL (ref 6.5–8.1)

## 2022-02-25 LAB — CBC
HCT: 46.5 % (ref 39.0–52.0)
Hemoglobin: 15.4 g/dL (ref 13.0–17.0)
MCH: 30.7 pg (ref 26.0–34.0)
MCHC: 33.1 g/dL (ref 30.0–36.0)
MCV: 92.6 fL (ref 80.0–100.0)
Platelets: 234 10*3/uL (ref 150–400)
RBC: 5.02 MIL/uL (ref 4.22–5.81)
RDW: 12.6 % (ref 11.5–15.5)
WBC: 11.4 10*3/uL — ABNORMAL HIGH (ref 4.0–10.5)
nRBC: 0 % (ref 0.0–0.2)

## 2022-02-25 LAB — URINALYSIS, ROUTINE W REFLEX MICROSCOPIC
Bilirubin Urine: NEGATIVE
Glucose, UA: NEGATIVE mg/dL
Hgb urine dipstick: NEGATIVE
Ketones, ur: NEGATIVE mg/dL
Leukocytes,Ua: NEGATIVE
Nitrite: NEGATIVE
Protein, ur: NEGATIVE mg/dL
Specific Gravity, Urine: 1.02 (ref 1.005–1.030)
pH: 5 (ref 5.0–8.0)

## 2022-02-25 LAB — TROPONIN I (HIGH SENSITIVITY): Troponin I (High Sensitivity): 12 ng/L (ref <18)

## 2022-02-25 LAB — CK: Total CK: 187 U/L (ref 49–397)

## 2022-02-25 MED ORDER — IOHEXOL 350 MG/ML SOLN
100.0000 mL | Freq: Once | INTRAVENOUS | Status: AC | PRN
Start: 2022-02-25 — End: 2022-02-25
  Administered 2022-02-25: 100 mL via INTRAVENOUS

## 2022-02-25 MED ORDER — HYDROMORPHONE HCL 1 MG/ML IJ SOLN
1.0000 mg | INTRAMUSCULAR | Status: AC
  Administered 2022-02-25: 1 mg via INTRAVENOUS
  Filled 2022-02-25: qty 1

## 2022-02-25 MED ORDER — ONDANSETRON HCL 4 MG/2ML IJ SOLN
4.0000 mg | INTRAMUSCULAR | Status: AC
  Administered 2022-02-25: 4 mg via INTRAVENOUS
  Filled 2022-02-25: qty 2

## 2022-02-25 MED ORDER — MORPHINE SULFATE (PF) 4 MG/ML IV SOLN: 4.0000 mg | Freq: Once | INTRAVENOUS | Status: DC

## 2022-02-25 MED ORDER — ONDANSETRON 4 MG PO TBDP
4.0000 mg | ORAL_TABLET | Freq: Once | ORAL | Status: AC
  Administered 2022-02-25: 4 mg via ORAL
  Filled 2022-02-25: qty 1

## 2022-02-25 MED ORDER — OXYCODONE HCL 5 MG PO TABS: 5.0000 mg | ORAL_TABLET | Freq: Four times a day (QID) | ORAL | 0 refills | Status: DC | PRN

## 2022-02-25 MED ORDER — LACTATED RINGERS IV BOLUS
1000.0000 mL | Freq: Once | INTRAVENOUS | Status: AC
  Administered 2022-02-25: 1000 mL via INTRAVENOUS

## 2022-02-25 MED ORDER — GADOBUTROL 1 MMOL/ML IV SOLN
10.0000 mL | Freq: Once | INTRAVENOUS | Status: AC | PRN
  Administered 2022-02-25: 10 mL via INTRAVENOUS

## 2022-02-25 MED ORDER — PREDNISONE 10 MG PO TABS: ORAL_TABLET | ORAL | 0 refills | Status: AC

## 2022-02-25 MED ORDER — OXYCODONE-ACETAMINOPHEN 5-325 MG PO TABS
2.0000 | ORAL_TABLET | Freq: Once | ORAL | Status: AC
  Administered 2022-02-25: 2 via ORAL
  Filled 2022-02-25: qty 2

## 2022-02-25 MED ORDER — DICLOFENAC SODIUM 1 % EX GEL: 4.0000 g | Freq: Three times a day (TID) | CUTANEOUS | 0 refills | Status: AC

## 2022-02-25 MED ORDER — KETOROLAC TROMETHAMINE 30 MG/ML IJ SOLN: 15.0000 mg | Freq: Once | INTRAMUSCULAR | Status: DC

## 2022-02-25 MED ORDER — DEXAMETHASONE SODIUM PHOSPHATE 10 MG/ML IJ SOLN: 10.0000 mg | Freq: Once | INTRAMUSCULAR | Status: DC

## 2022-02-25 NOTE — ED Notes (Signed)
Report to reina, rn.  

## 2022-02-25 NOTE — ED Triage Notes (Signed)
Pt presents to ER from home c/o upper back pain, and muscular pain all across upper body.  Pt states pain is much worse with movement.  Endorses taking muscle relaxer at home without relief.  Pt denies any recent trauma or heavy lifting.  Pt is A&O x4 at this time in NAD.

## 2022-02-25 NOTE — ED Provider Notes (Signed)
[facility] Provider Note    Event Date/Time   First MD Initiated Contact with Patient 02/25/22 [phone]     (approximate)   History   Back Pain   HPI  JAX KENTNER is a 61 y.o. male whose history is most notable for DVT in his right lower extremity and a history of PE for which he takes Eliquis.  He is also a Energy manager.  He presents with a cute onset and severe pain that seems to be the worst in the middle of his back and radiating around both sides and around to his chest, but essentially involves his entire torso.  Anytime he moves around the pain is severe, and he also has spasms or sharp stabbing pains when he is just at rest.  Movement definitely makes it worse.  He does not remember any recent trauma or heavy lifting, and states that he has been lifting the same amount and doing the same regimen as usual.  He has no pain in his abdomen.  He has shortness of breath only because taking a deep breath hurts.  No dysuria.  No numbness nor weakness in his extremities.     Physical Exam   Triage Vital Signs: ED Triage Vitals  Enc Vitals Group     BP 02/25/22 0105 (!) 159/107     Pulse Rate 02/25/22 0105 84     Resp 02/25/22 0105 18     Temp 02/25/22 0105 98.1 F (36.7 C)     Temp Source 02/25/22 0105 Oral     SpO2 02/25/22 0105 99 %     Weight 02/25/22 0108 108.9 kg (240 lb)     Height 02/25/22 0108 1.778 m ('5\' 10"'$ )     Head Circumference --      Peak Flow --      Pain Score 02/25/22 0107 6     Pain Loc --      Pain Edu? --      Excl. in [place]? --     Most recent vital signs: Vitals:   02/25/22 0105 02/25/22 0449  BP: (!) 159/107 (!) 144/95  Pulse: 84 71  Resp: 18 20  Temp: 98.1 F (36.7 C)   SpO2: 99% 98%     General: Awake, appears to be in a significant amount of pain especially with any movement. CV:  Good peripheral perfusion.  Normal heart sounds. Resp:  Normal effort.  Lungs are clear to auscultation. Abd:  No distention.   No tenderness to palpation.  No abdominal bruit. Other:  No rash on his back in the thoracic region along a dermatomal distribution.  No appreciable musculoskeletal abnormalities.  Muscular body habitus.   ED Results / Procedures / Treatments   Labs (all labs ordered are listed, but only abnormal results are displayed) Labs Reviewed  CBC - Abnormal; Notable for the following components:      Result Value   WBC 11.4 (*)    All other components within normal limits  COMPREHENSIVE METABOLIC PANEL - Abnormal; Notable for the following components:   Glucose, Bld 120 (*)    BUN 27 (*)    Creatinine, Ser 1.70 (*)    GFR, Estimated 46 (*)    All other components within normal limits  URINALYSIS, ROUTINE W REFLEX MICROSCOPIC - Abnormal; Notable for the following components:   Color, Urine YELLOW (*)    APPearance CLEAR (*)    All other components within normal limits  CK  RADIOLOGY See hospital course for details: CTA chest/abdomen/pelvis unremarkable with no AAS or other acute abnormality.    PROCEDURES:  Critical Care performed: No  .1-3 Lead EKG Interpretation  Performed by: Hinda Kehr, MD Authorized by: Hinda Kehr, MD     Interpretation: normal     ECG rate:  65   ECG rate assessment: normal     Rhythm: sinus rhythm     Ectopy: none     Conduction: normal      MEDICATIONS ORDERED IN ED: Medications  lactated ringers bolus 1,000 mL (has no administration in time range)  HYDROmorphone (DILAUDID) injection 1 mg (has no administration in time range)  ondansetron (ZOFRAN) injection 4 mg (has no administration in time range)     IMPRESSION / MDM / ASSESSMENT AND PLAN / ED COURSE  I reviewed the triage vital signs and the nursing notes.                              Differential diagnosis includes, but is not limited to, AAS, radiculopathy, musculoskeletal strain, ACS, electrolyte or metabolic abnormality.  Patient's presentation is most consistent with  acute presentation with potential threat to life or bodily function.  Patient's symptoms seem musculoskeletal or radicular, but the distribution seems to be throughout the patient's entire torso even though it is most notable in the upper thoracic spine.  Given the morbidity/mortality of missed AAS, I will proceed with CTA chest/abdomen/pelvis to rule out aortic aneurysm.  Labs ordered initially include CMP, CK, urinalysis, CBC.  Labs are generally normal although the patient's creatinine is elevated at 1.7 although this could be due to his body habitus and muscle mass.  I ordered 1 L LR IV bolus.  For pain I ordered Dilaudid 1 mg IV and Zofran 4 mg IV to prevent nausea.  The patient is on the cardiac monitor to evaluate for evidence of arrhythmia and/or significant heart rate changes.   Clinical Course as of 02/25/22 0804  Sat Feb 25, 2022  0715 I viewed and interpreted the patient's CTA chest/abdomen/pelvis.  I see no evidence of dissection.  The radiologist agrees that there is no dissection and did not identify any other acute injury or cause of the patient's symptoms.  Transferring ED care to Dr. Ellender Hose for further evaluation. [CF]    Clinical Course User Index [CF] Hinda Kehr, MD     FINAL CLINICAL IMPRESSION(S) / ED DIAGNOSES   Final diagnoses:  None     Rx / DC Orders   ED Discharge Orders     None        Note:  This document was prepared using Dragon voice recognition software and may include unintentional dictation errors.   Hinda Kehr, MD 02/25/22 [phone]

## 2022-02-25 NOTE — Discharge Instructions (Addendum)
NO HEAVY LIFTING FOR THE NEXT 7-10 DAYS  Your MRI findings showed acute arthritis/inflammation in your spine.  This can be from a number of causes, possibly weight lifting.  For now: Take the Prednisone as prescribed, with food in the mornings Take an antacid when taking this (Omeprazole over-the-counter) Take the Oxycydone for severe pain Take TYLENOL 1000 mg every 8 hours for 5-7 days  Take a stool softener with the Oxycodone  If pain does not resolve or returns, call Spine surgery for follow-up and further discussion

## 2022-02-25 NOTE — ED Provider Notes (Signed)
Patient care assumed at 7 AM.  Briefly, the patient is a 61 year old male here with severe thoracic and lumbar back pain radiating around to his abdomen.  No history of chronic or recurrent pain.  Differential includes thoracic or upper lumbar disc herniation versus epidural abscess in the setting of heavy lifting and anticoagulant use.  No risk factors for osteomyelitis or epidural abscess.  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  Patient has no known history of ankylosing spondylitis or other autoimmune condition.  Suspect this is related to his fairly heavy weightlifting.  Discussed the imaging with Dr. Cari Caraway of neurosurgery.  Will treat with steroids and analgesia for now with outpatient follow-up if pain does not resolve.  I had a long discussion with him regarding these findings, as well as need to discuss with his PCP and outpatient spine follow-up if it does not improve.  Return precautions given.  No evidence of infection or abscess.  Will hold on NSAIDs in the setting of his anticoagulant use.Alejandro Bruce, MD 02/25/22 [phone]

## 2022-02-26 ENCOUNTER — Encounter: Payer: Self-pay | Admitting: Family Medicine

## 2022-02-26 DIAGNOSIS — M47819 Spondylosis without myelopathy or radiculopathy, site unspecified: Secondary | ICD-10-CM | POA: Insufficient documentation

## 2022-03-02 ENCOUNTER — Telehealth: Payer: Self-pay | Admitting: *Deleted

## 2022-03-02 NOTE — Chronic Care Management (AMB) (Signed)
  Care Coordination  Note  03/02/2022 Name: MILBURN FREENEY MRN: [id number] DOB: 03/18/1961  JATAVIS MALEK is a 61 y.o. year old male who is a primary care patient of Ria Bush, MD. I reached out to Silvano Bilis by phone today to offer care coordination services.      Mr. Congrove was given information about Care Coordination services today including:  The Care Coordination services include support from the care team which includes your Nurse Coordinator, Clinical Social Worker, or Pharmacist.  The Care Coordination team is here to help remove barriers to the health concerns and goals most important to you. Care Coordination services are voluntary and the patient may decline or stop services at any time by request to their care team member.   Patient agreed to services and verbal consent obtained.   Follow up plan: Telephone appointment with care coordination team member scheduled for: 03/03/2022  Julian Hy, [place] Direct Dial: [phone]

## 2022-03-03 ENCOUNTER — Ambulatory Visit: Payer: Self-pay

## 2022-03-03 NOTE — Patient Instructions (Signed)
Visit Information  Thank you for taking time to visit with me today. Please don't hesitate to contact me if I can be of assistance to you.   Following are the goals we discussed today:   Goals Addressed             This Visit's Progress    Patient States: "I am concerned about restarting my weight lifting due to recent back pain"       Care Coordination Interventions: Evaluation of current treatment plan related to back pain and patient's adherence to plan as established by provider Advised patient to ease back into work out regimen slowly, consider stretching and using lighter  Reviewed medications with patient and discussed importance of compliance.  Reviewed scheduled/upcoming provider appointments including           If you are experiencing a Mental Health or [place] or need someone to talk to, please call the Suicide and Crisis Lifeline: 988 call 1-800-273-TALK (toll free, 24 hour hotline)  Patient verbalizes understanding of instructions and care plan provided today and agrees to view in [place]. Active MyChart status and patient understanding of how to access instructions and care plan via MyChart confirmed with patient.     No further follow up required:    Quinn Plowman RN,BSN,CCM RN Care Manager Coordinator [place]  [phone]

## 2022-04-04 ENCOUNTER — Other Ambulatory Visit: Payer: Self-pay | Admitting: Family Medicine

## 2022-04-04 DIAGNOSIS — G8929 Other chronic pain: Secondary | ICD-10-CM

## 2022-04-04 DIAGNOSIS — M5030 Other cervical disc degeneration, unspecified cervical region: Secondary | ICD-10-CM

## 2022-04-04 NOTE — Telephone Encounter (Signed)
Refill request Tizanidine Last refill 02/06/22 #30/1 Last office visit 12/27/21

## 2022-06-01 ENCOUNTER — Other Ambulatory Visit: Payer: Self-pay | Admitting: Family Medicine

## 2022-06-01 DIAGNOSIS — M5030 Other cervical disc degeneration, unspecified cervical region: Secondary | ICD-10-CM

## 2022-06-01 DIAGNOSIS — G8929 Other chronic pain: Secondary | ICD-10-CM

## 2022-06-01 NOTE — Telephone Encounter (Signed)
Tizanidine Last filled:  05/04/22, #30 Last OV:  12/27/21, CPE Next OV:  12/29/22, CPE

## 2022-06-05 ENCOUNTER — Encounter (INDEPENDENT_AMBULATORY_CARE_PROVIDER_SITE_OTHER): Payer: Self-pay

## 2022-07-28 ENCOUNTER — Other Ambulatory Visit: Payer: Self-pay | Admitting: Family Medicine

## 2022-07-28 DIAGNOSIS — M5030 Other cervical disc degeneration, unspecified cervical region: Secondary | ICD-10-CM

## 2022-07-28 DIAGNOSIS — G8929 Other chronic pain: Secondary | ICD-10-CM

## 2022-09-23 ENCOUNTER — Other Ambulatory Visit: Payer: Self-pay | Admitting: Family Medicine

## 2022-09-23 DIAGNOSIS — G8929 Other chronic pain: Secondary | ICD-10-CM

## 2022-09-23 DIAGNOSIS — M5030 Other cervical disc degeneration, unspecified cervical region: Secondary | ICD-10-CM

## 2022-09-25 NOTE — Telephone Encounter (Signed)
Tizanidine Last filled:  08/28/22, #30 Last OV:  12/27/21, CPE Next OV:  12/29/22, CPE

## 2022-09-26 NOTE — Telephone Encounter (Signed)
ERx 

## 2022-12-19 ENCOUNTER — Other Ambulatory Visit: Payer: Self-pay | Admitting: Family Medicine

## 2022-12-19 ENCOUNTER — Other Ambulatory Visit (INDEPENDENT_AMBULATORY_CARE_PROVIDER_SITE_OTHER): Payer: BC Managed Care – PPO

## 2022-12-19 DIAGNOSIS — Z125 Encounter for screening for malignant neoplasm of prostate: Secondary | ICD-10-CM

## 2022-12-19 DIAGNOSIS — E559 Vitamin D deficiency, unspecified: Secondary | ICD-10-CM

## 2022-12-19 DIAGNOSIS — N289 Disorder of kidney and ureter, unspecified: Secondary | ICD-10-CM

## 2022-12-19 DIAGNOSIS — E785 Hyperlipidemia, unspecified: Secondary | ICD-10-CM | POA: Diagnosis not present

## 2022-12-19 DIAGNOSIS — M47819 Spondylosis without myelopathy or radiculopathy, site unspecified: Secondary | ICD-10-CM | POA: Diagnosis not present

## 2022-12-19 LAB — COMPREHENSIVE METABOLIC PANEL
ALT: 30 U/L (ref 0–53)
AST: 26 U/L (ref 0–37)
Albumin: 4 g/dL (ref 3.5–5.2)
Alkaline Phosphatase: 65 U/L (ref 39–117)
BUN: 23 mg/dL (ref 6–23)
CO2: 27 mEq/L (ref 19–32)
Calcium: 9.1 mg/dL (ref 8.4–10.5)
Chloride: 104 mEq/L (ref 96–112)
Creatinine, Ser: 1.43 mg/dL (ref 0.40–1.50)
GFR: 53.18 mL/min — ABNORMAL LOW (ref >60.00)
Glucose, Bld: 95 mg/dL (ref 70–99)
Potassium: 4.5 mEq/L (ref 3.5–5.1)
Sodium: 141 mEq/L (ref 135–145)
Total Bilirubin: 0.9 mg/dL (ref 0.2–1.2)
Total Protein: 6.2 g/dL (ref 6.0–8.3)

## 2022-12-19 LAB — SEDIMENTATION RATE: Sed Rate: 6 mm/hr (ref 0–20)

## 2022-12-19 LAB — LIPID PANEL
Cholesterol: 193 mg/dL (ref 0–200)
HDL: 58.8 mg/dL (ref >39.00)
LDL Cholesterol: 105 mg/dL — ABNORMAL HIGH (ref 0–99)
NonHDL: 134.14
Total CHOL/HDL Ratio: 3
Triglycerides: 146 mg/dL (ref 0.0–149.0)
VLDL: 29.2 mg/dL (ref 0.0–40.0)

## 2022-12-19 LAB — PSA: PSA: 0.53 ng/mL (ref 0.10–4.00)

## 2022-12-19 LAB — CBC WITH DIFFERENTIAL/PLATELET
Basophils Absolute: 0.1 10*3/uL (ref 0.0–0.1)
Basophils Relative: 1.1 % (ref 0.0–3.0)
Eosinophils Absolute: 0.2 10*3/uL (ref 0.0–0.7)
Eosinophils Relative: 4.1 % (ref 0.0–5.0)
HCT: 43.6 % (ref 39.0–52.0)
Hemoglobin: 15 g/dL (ref 13.0–17.0)
Lymphocytes Relative: 32.9 % (ref 12.0–46.0)
Lymphs Abs: 1.7 10*3/uL (ref 0.7–4.0)
MCHC: 34.4 g/dL (ref 30.0–36.0)
MCV: 91.5 fl (ref 78.0–100.0)
Monocytes Absolute: 0.5 10*3/uL (ref 0.1–1.0)
Monocytes Relative: 9.1 % (ref 3.0–12.0)
Neutro Abs: 2.7 10*3/uL (ref 1.4–7.7)
Neutrophils Relative %: 52.8 % (ref 43.0–77.0)
Platelets: 237 10*3/uL (ref 150.0–400.0)
RBC: 4.77 Mil/uL (ref 4.22–5.81)
RDW: 13.4 % (ref 11.5–15.5)
WBC: 5.1 10*3/uL (ref 4.0–10.5)

## 2022-12-19 LAB — MICROALBUMIN / CREATININE URINE RATIO
Creatinine,U: 228.2 mg/dL
Microalb Creat Ratio: 0.4 mg/g (ref 0.0–30.0)
Microalb, Ur: 0.8 mg/dL (ref 0.0–1.9)

## 2022-12-19 LAB — PHOSPHORUS: Phosphorus: 3.4 mg/dL (ref 2.3–4.6)

## 2022-12-19 LAB — VITAMIN D 25 HYDROXY (VIT D DEFICIENCY, FRACTURES): VITD: 33.83 ng/mL (ref 30.00–100.00)

## 2022-12-20 LAB — PARATHYROID HORMONE, INTACT (NO CA): PTH: 55 pg/mL (ref 16–77)

## 2022-12-21 LAB — HLA-B27 ANTIGEN: HLA-B27 Antigen: NEGATIVE

## 2022-12-29 ENCOUNTER — Encounter: Payer: Self-pay | Admitting: Family Medicine

## 2022-12-29 ENCOUNTER — Ambulatory Visit (INDEPENDENT_AMBULATORY_CARE_PROVIDER_SITE_OTHER): Payer: BC Managed Care – PPO | Admitting: Family Medicine

## 2022-12-29 VITALS — BP 146/84 | HR 68 | Temp 97.3°F | Ht 69.5 in | Wt 262.4 lb

## 2022-12-29 DIAGNOSIS — M47819 Spondylosis without myelopathy or radiculopathy, site unspecified: Secondary | ICD-10-CM | POA: Diagnosis not present

## 2022-12-29 DIAGNOSIS — N289 Disorder of kidney and ureter, unspecified: Secondary | ICD-10-CM

## 2022-12-29 DIAGNOSIS — Z0000 Encounter for general adult medical examination without abnormal findings: Secondary | ICD-10-CM

## 2022-12-29 DIAGNOSIS — Z86711 Personal history of pulmonary embolism: Secondary | ICD-10-CM | POA: Diagnosis not present

## 2022-12-29 DIAGNOSIS — E559 Vitamin D deficiency, unspecified: Secondary | ICD-10-CM

## 2022-12-29 DIAGNOSIS — Z86718 Personal history of other venous thrombosis and embolism: Secondary | ICD-10-CM | POA: Diagnosis not present

## 2022-12-29 DIAGNOSIS — J302 Other seasonal allergic rhinitis: Secondary | ICD-10-CM

## 2022-12-29 DIAGNOSIS — M5030 Other cervical disc degeneration, unspecified cervical region: Secondary | ICD-10-CM

## 2022-12-29 DIAGNOSIS — E785 Hyperlipidemia, unspecified: Secondary | ICD-10-CM

## 2022-12-29 MED ORDER — TIZANIDINE HCL 4 MG PO TABS: ORAL_TABLET | ORAL | 6 refills | Status: DC

## 2022-12-29 MED ORDER — APIXABAN 5 MG PO TABS: 5.0000 mg | ORAL_TABLET | Freq: Two times a day (BID) | ORAL | 4 refills | Status: DC

## 2022-12-29 NOTE — Progress Notes (Signed)
Ph: [phone] Fax: [phone]   Patient ID: Alejandro Decker, male    DOB: 07-28-1961, 62 y.o.   MRN: [id number]  This visit was conducted in person.  BP (!) 146/84   Pulse 68   Temp (!) 97.3 F (36.3 C) (Temporal)   Ht 5' 9.5" (1.765 m)   Wt 262 lb 6 oz (119 kg)   SpO2 96%   BMI 38.19 kg/m    CC: CPE Subjective:   HPI: Alejandro Decker is a 62 y.o. male presenting on 12/29/2022 for Annual Exam   Hospitalized 2022 for acute RLE DVT and extensive bilateral PEs s/p thrombolysis and mechanical thrombectomy, rec lifelong anticoagulation on eliquis 5mg  bid. Hypercoagulable panel returned normal (normal FVL, PT gene, protein C or C&S, AT3, anti-phospholipid antibody). Repeat venous US showed resolution of RLE DVT. Has been recommended compression stockings as well.   Acute back pain spondyloarthropathy 02/2022 s/p ER visit. Thought may have been related to exercise routine he was doing. Treated with prednisone and oxycodone with resolution. He changed exercise routine with benefit.   He does note ongoing soreness and stiffness to back and neck worse when he first wakes up in the morning improves as day goes on - predominant spine and neck, not other joints affected. Morning stiffness lasts a couple hours. This has been ongoing for a few years.  Takes tylenol 1300mg  bid as well as tizanidine muscle relaxant at night with benefit.  No redness, warmth or swelling. No new rashes, no redness to eyes, no oral lesions. No swelling of digits of hands or feet.   Preventative: COLONOSCOPY WITH PROPOFOL 01/18/2022 - TA, HP, rpt 7 yrs Tobi Bastos, Sharlet Salina, MD)  Prostate cancer screening - yearly PSA. No BPH symptom.  Lung cancer screening - not eligible  Flu yearly at work  COVID vaccine J&J 11/2019, Moderna booster 06/2020  Td 2010, Tdap 12/2020 Shingrix - 12/2020, 04/2021  RSV - discussed  Seat belt use discussed Sunscreen use discussed. No suspicious moles noted. Sees [place] [facility], h/o porokeratosis  05/2018. 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 Interim medical history since our last visit reviewed. Allergies and medications reviewed and updated. Outpatient Medications Prior to Visit  Medication Sig Dispense Refill   acetaminophen (TYLENOL) 650 MG CR tablet Take 650 mg by mouth every 8 (eight) hours as needed for pain.     Cholecalciferol (VITAMIN D3) 25 MCG (1000 UT) CAPS Take 1 capsule (1,000 Units total) by mouth daily. 30 capsule    diphenhydrAMINE (BENADRYL) 25 mg capsule Take 50 mg by mouth at bedtime as needed.     fexofenadine-pseudoephedrine (ALLEGRA-D 24) 180-240 MG 24 hr tablet Take 1 tablet by mouth daily. Patient states takes as needed     apixaban (ELIQUIS) 5 MG TABS tablet Take 1 tablet (5 mg total) by mouth 2 (two) times daily. 180 tablet 3   tiZANidine (ZANAFLEX) 4 MG tablet TAKE 1 TABLET BY MOUTH AT BEDTIME AS NEEDED FOR MUSCLE SPASMS. 30 tablet 3   cetirizine (ZYRTEC) 10 MG tablet Take 10 mg  by mouth daily as needed. (Patient not taking: Reported on 03/03/2022)     oxyCODONE (ROXICODONE) 5 MG immediate release tablet Take 1-2 tablets (5-10 mg total) by mouth every 6 (six) hours as needed for moderate pain or severe pain (no more than 6 tabs daily). (Patient not taking: Reported on 03/03/2022) 20 tablet 0   No facility-administered medications prior to visit.     Per HPI unless specifically indicated in ROS section below Review of Systems  Constitutional:  Negative for activity change, appetite change, chills, fatigue, fever and unexpected weight change.  HENT:   Negative for hearing loss.   Eyes:  Negative for visual disturbance.  Respiratory:  Negative for cough, chest tightness, shortness of breath and wheezing.   Cardiovascular:  Negative for chest pain, palpitations and leg swelling.  Gastrointestinal:  Negative for abdominal distention, abdominal pain, blood in stool, constipation, diarrhea, nausea and vomiting.  Genitourinary:  Negative for difficulty urinating and hematuria.  Musculoskeletal:  Negative for arthralgias, myalgias and neck pain.  Skin:  Negative for rash.  Neurological:  Negative for dizziness, seizures, syncope and headaches.  Hematological:  Negative for adenopathy. Does not bruise/bleed easily.  Psychiatric/Behavioral:  Negative for dysphoric mood. The patient is not nervous/anxious.     Objective:  BP (!) 146/84   Pulse 68   Temp (!) 97.3 F (36.3 C) (Temporal)   Ht 5' 9.5" (1.765 m)   Wt 262 lb 6 oz (119 kg)   SpO2 96%   BMI 38.19 kg/m   Wt Readings from Last 3 Encounters:  12/29/22 262 lb 6 oz (119 kg)  02/25/22 240 lb (108.9 kg)  01/18/22 245 lb (111.1 kg)      Physical Exam Vitals and nursing note reviewed.  Constitutional:      General: He is not in acute distress.    Appearance: Normal appearance. He is well-developed. He is not ill-appearing.  HENT:     Head: Normocephalic and atraumatic.     Right Ear: Hearing, tympanic membrane, ear canal and external ear normal.     Left Ear: Hearing, tympanic membrane, ear canal and external ear normal.     Nose: Nose normal.     Mouth/Throat:     Mouth: Mucous membranes are moist.     Pharynx: Oropharynx is clear. No oropharyngeal exudate or posterior oropharyngeal erythema.  Eyes:     General: No scleral icterus.    Extraocular Movements: Extraocular movements intact.     Conjunctiva/sclera: Conjunctivae normal.     Pupils: Pupils are equal, round, and reactive to light.  Neck:     Thyroid: No thyroid mass or thyromegaly.  Cardiovascular:     Rate and  Rhythm: Normal rate and regular rhythm.     Pulses: Normal pulses.          Radial pulses are 2+ on the right side and 2+ on the left side.     Heart sounds: Normal heart sounds. No murmur heard. Pulmonary:     Effort: Pulmonary effort is normal. No respiratory distress.     Breath sounds: Normal breath sounds. No wheezing, rhonchi or rales.  Abdominal:     General: Bowel sounds are normal. There is no distension.     Palpations: Abdomen is soft. There is no mass.     Tenderness: There is no abdominal tenderness. There is no guarding or rebound.     Hernia: No hernia is present.  Musculoskeletal:        General: Normal range  of motion.     Cervical back: Normal range of motion and neck supple.     Right lower leg: No edema.     Left lower leg: No edema.     Comments: No midline back pain or paraspinous mm tenderness.   Lymphadenopathy:     Cervical: No cervical adenopathy.  Skin:    General: Skin is warm and dry.     Findings: No rash.  Neurological:     General: No focal deficit present.     Mental Status: He is alert and oriented to person, place, and time.  Psychiatric:        Mood and Affect: Mood normal.        Behavior: Behavior normal.        Thought Content: Thought content normal.        Judgment: Judgment normal.       Results for orders placed or performed in visit on 12/19/22  HLA-B27 antigen  Result Value Ref Range   HLA-B27 Antigen NEGATIVE NEGATIVE  Sedimentation rate  Result Value Ref Range   Sed Rate 6 0 - 20 mm/hr  PSA  Result Value Ref Range   PSA 0.53 0.10 - 4.00 ng/mL  CBC with Differential/Platelet  Result Value Ref Range   WBC 5.1 4.0 - 10.5 K/uL   RBC 4.77 4.22 - 5.81 Mil/uL   Hemoglobin 15.0 13.0 - 17.0 g/dL   HCT 30.8 65.7 - 84.6 %   MCV 91.5 78.0 - 100.0 fl   MCHC 34.4 30.0 - 36.0 g/dL   RDW 96.2 95.2 - 84.1 %   Platelets 237.0 150.0 - 400.0 K/uL   Neutrophils Relative % 52.8 43.0 - 77.0 %   Lymphocytes Relative 32.9 12.0 - 46.0 %    Monocytes Relative 9.1 3.0 - 12.0 %   Eosinophils Relative 4.1 0.0 - 5.0 %   Basophils Relative 1.1 0.0 - 3.0 %   Neutro Abs 2.7 1.4 - 7.7 K/uL   Lymphs Abs 1.7 0.7 - 4.0 K/uL   Monocytes Absolute 0.5 0.1 - 1.0 K/uL   Eosinophils Absolute 0.2 0.0 - 0.7 K/uL   Basophils Absolute 0.1 0.0 - 0.1 K/uL  Parathyroid hormone, intact (no Ca)  Result Value Ref Range   PTH 55 16 - 77 pg/mL  Microalbumin / creatinine urine ratio  Result Value Ref Range   Microalb, Ur 0.8 0.0 - 1.9 mg/dL   Creatinine,U 324.4 mg/dL   Microalb Creat Ratio 0.4 0.0 - 30.0 mg/g  VITAMIN D 25 Hydroxy (Vit-D Deficiency, Fractures)  Result Value Ref Range   VITD 33.83 30.00 - 100.00 ng/mL  Phosphorus  Result Value Ref Range   Phosphorus 3.4 2.3 - 4.6 mg/dL  Comprehensive metabolic panel  Result Value Ref Range   Sodium 141 135 - 145 mEq/L   Potassium 4.5 3.5 - 5.1 mEq/L   Chloride 104 96 - 112 mEq/L   CO2 27 19 - 32 mEq/L   Glucose, Bld 95 70 - 99 mg/dL   BUN 23 6 - 23 mg/dL   Creatinine, Ser 0.10 0.40 - 1.50 mg/dL   Total Bilirubin 0.9 0.2 - 1.2 mg/dL   Alkaline Phosphatase 65 39 - 117 U/L   AST 26 0 - 37 U/L   ALT 30 0 - 53 U/L   Total Protein 6.2 6.0 - 8.3 g/dL   Albumin 4.0 3.5 - 5.2 g/dL   GFR 27.25 (L) >36.64 mL/min   Calcium 9.1 8.4 - 10.5 mg/dL  Lipid  panel  Result Value Ref Range   Cholesterol 193 0 - 200 mg/dL   Triglycerides 413.2 0.0 - 149.0 mg/dL   HDL 44.01 >02.72 mg/dL   VLDL 53.6 0.0 - 64.4 mg/dL   LDL Cholesterol 034 (H) 0 - 99 mg/dL   Total CHOL/HDL Ratio 3    NonHDL 134.14    Lab Results  Component Value Date   ANA NEG 10/16/2013   MRI LUMBAR AND THORACIC SPINE W WO CONTRAST IMPRESSION: Thoracic spine:  Bulky spondylitic spurring in the midthoracic spine with edema associated with osteophytes and vertebral corners, pattern suggesting active spondyloarthropathy. No neural impingement. Lumbar spine: Lumbar spine degeneration primarily at L3-4 and below. No high-grade stenosis.   Electronically Signed   By: Tiburcio Pea M.D.   On: 02/25/2022 12:44  Assessment & Plan:   Problem List Items Addressed This Visit     HLD (hyperlipidemia)    Chronic, stable off medication. Encouraged increased fiber in diet to lower LDL.  The 10-year ASCVD risk score (Arnett DK, et al., 2019) is: 9.3%   Values used to calculate the score:     Age: 54 years     Sex: Male     Is Non-Hispanic African American: No     Diabetic: No     Tobacco smoker: No     Systolic Blood Pressure: 146 mmHg     Is BP treated: No     HDL Cholesterol: 58.8 mg/dL     Total Cholesterol: 193 mg/dL       Relevant Medications   apixaban (ELIQUIS) 5 MG TABS tablet   Allergic rhinitis    Continues PRN zyrtec, benadryl, allegra D       Healthcare maintenance - Primary (Chronic)    Preventative protocols reviewed and updated unless pt declined. Discussed healthy diet and lifestyle.       Severe obesity (BMI 35.0-39.9) with comorbidity (HCC)    Encouraged healthy diet and lifestyle efforts. He regularly works out.       Renal insufficiency    Chronic, stable. Muscle mass contributes to elevated creatinine.       History of pulmonary embolism   History of venous thromboembolism    Continue lifelong anticoagulation       Relevant Medications   apixaban (ELIQUIS) 5 MG TABS tablet   DDD (degenerative disc disease), cervical    Continues scheduled tylenol with zanaflex PRN.       Relevant Medications   tiZANidine (ZANAFLEX) 4 MG tablet   Vitamin D deficiency    Continue vit D 1000 IU daily.       Spondyloarthropathy    Episode of acute spondyloarthritis 02/2022 presenting as severe back pain after working out treated with prednisone and oxycodone. This resolved. Notes ongoing symptoms of inflammatory arthritis - for this reason did recommend rheumatology evaluation. Referral placed to [facility].  HLA-B27 Ag and ESR normal.       Relevant Orders   Ambulatory referral to Rheumatology      Meds ordered this encounter  Medications   apixaban (ELIQUIS) 5 MG TABS tablet    Sig: Take 1 tablet (5 mg total) by mouth 2 (two) times daily.    Dispense:  180 tablet    Refill:  4   tiZANidine (ZANAFLEX) 4 MG tablet    Sig: TAKE 1 TABLET BY MOUTH AT BEDTIME AS NEEDED FOR MUSCLE SPASMS.    Dispense:  30 tablet    Refill:  6    Orders Placed This  Encounter  Procedures   Ambulatory referral to Rheumatology    Referral Priority:   Routine    Referral Type:   Consultation    Referral Reason:   Specialty Services Required    Requested Specialty:   Rheumatology    Number of Visits Requested:   1    Patient Instructions  I'd like you to see rheumatologist for evaluation of possible inflammatory arthritis in setting of episode of spondyloarthritis last year.  Good to see you today You are doing well today Return as needed or in 1 year for next physical.   Follow up plan: Return in about 1 year (around 12/29/2023) for annual exam, prior fasting for blood work.  Eustaquio Boyden, MD

## 2022-12-29 NOTE — Patient Instructions (Addendum)
I'd like you to see rheumatologist for evaluation of possible inflammatory arthritis in setting of episode of spondyloarthritis last year.  Good to see you today You are doing well today Return as needed or in 1 year for next physical.

## 2022-12-29 NOTE — Assessment & Plan Note (Signed)
Continues PRN zyrtec, benadryl, allegra D

## 2022-12-29 NOTE — Assessment & Plan Note (Signed)
Chronic, stable. Muscle mass contributes to elevated creatinine.

## 2022-12-29 NOTE — Assessment & Plan Note (Signed)
Continue vit D 1000 IU daily. 

## 2022-12-29 NOTE — Assessment & Plan Note (Signed)
Encouraged healthy diet and lifestyle efforts. He regularly works out.

## 2022-12-29 NOTE — Assessment & Plan Note (Signed)
Preventative protocols reviewed and updated unless pt declined. Discussed healthy diet and lifestyle.  

## 2022-12-29 NOTE — Assessment & Plan Note (Signed)
Chronic, stable off medication. Encouraged increased fiber in diet to lower LDL.  The 10-year ASCVD risk score (Arnett DK, et al., 2019) is: 9.3%   Values used to calculate the score:     Age: 62 years     Sex: Male     Is Non-Hispanic African American: No     Diabetic: No     Tobacco smoker: No     Systolic Blood Pressure: 146 mmHg     Is BP treated: No     HDL Cholesterol: 58.8 mg/dL     Total Cholesterol: 193 mg/dL

## 2022-12-29 NOTE — Assessment & Plan Note (Signed)
Episode of acute spondyloarthritis 02/2022 presenting as severe back pain after working out treated with prednisone and oxycodone. This resolved. Notes ongoing symptoms of inflammatory arthritis - for this reason did recommend rheumatology evaluation. Referral placed to [facility]  HLA-B27 Ag and ESR normal.

## 2022-12-29 NOTE — Assessment & Plan Note (Signed)
Continues scheduled tylenol with zanaflex PRN.

## 2022-12-29 NOTE — Assessment & Plan Note (Signed)
Continue lifelong anticoagulation 

## 2023-01-03 DIAGNOSIS — L578 Other skin changes due to chronic exposure to nonionizing radiation: Secondary | ICD-10-CM | POA: Diagnosis not present

## 2023-01-03 DIAGNOSIS — L814 Other melanin hyperpigmentation: Secondary | ICD-10-CM | POA: Diagnosis not present

## 2023-01-03 DIAGNOSIS — L817 Pigmented purpuric dermatosis: Secondary | ICD-10-CM | POA: Diagnosis not present

## 2023-01-03 DIAGNOSIS — L821 Other seborrheic keratosis: Secondary | ICD-10-CM | POA: Diagnosis not present

## 2023-01-04 ENCOUNTER — Encounter: Payer: Self-pay | Admitting: *Deleted

## 2023-03-02 ENCOUNTER — Ambulatory Visit: Payer: BC Managed Care – PPO | Admitting: Primary Care

## 2023-03-02 ENCOUNTER — Encounter: Payer: Self-pay | Admitting: Primary Care

## 2023-03-02 VITALS — BP 162/96 | HR 60 | Temp 97.5°F | Ht 69.5 in | Wt 257.0 lb

## 2023-03-02 DIAGNOSIS — M47819 Spondylosis without myelopathy or radiculopathy, site unspecified: Secondary | ICD-10-CM | POA: Diagnosis not present

## 2023-03-02 MED ORDER — PREDNISONE 20 MG PO TABS: ORAL_TABLET | ORAL | 0 refills | Status: DC

## 2023-03-02 MED ORDER — KETOROLAC TROMETHAMINE 60 MG/2ML IM SOLN
60.0000 mg | Freq: Once | INTRAMUSCULAR | Status: AC
  Administered 2023-03-02: 60 mg via INTRAMUSCULAR

## 2023-03-02 NOTE — Assessment & Plan Note (Signed)
Acute on chronic flare secondary to recent strenuous physical activity.   Avoid back and upper body workouts at this time. Continue Tylenol as needed.  Toradol 60 mg provided today intramuscularly.  He is managed on Eliquis.  Start prednisone tablets.  Take 60 mg daily x 3 days, then 40 mg x 3 days, then 20 mg x 3 days. Follow-up with rheumatology as scheduled.  Reviewed MR thoracic and lumbar from 2023

## 2023-03-02 NOTE — Patient Instructions (Signed)
Start prednisone tablets tomorrow.  Take 3 tablets my mouth once daily in the morning for 3, then 2 tablets for 3 days, then 1 tablet for 3 days.  Avoid upper body and back exercises for now.  Follow-up with rheumatology as scheduled.  It was a pleasure meeting you!

## 2023-03-02 NOTE — Addendum Note (Signed)
Addended by: Lonia Blood on: 03/02/2023 09:03 AM   Modules accepted: Orders

## 2023-03-02 NOTE — Progress Notes (Signed)
Subjective:    Patient ID: Alejandro Decker, male    DOB: [birth year], 61 y.o.   MRN: [id number]  Back Pain Pertinent negatives include no numbness or weakness.    Alejandro Decker is a very pleasant 62 y.o. male patient of Dr. Sharen Hones with a history of cervical degenerative disc disease, spondyloarthropathy, obesity, renal insufficiency, history of of pulmonary embolism who presents today to discuss back pain.  Long history of chronic back pain. His back pain is located to the left thoracic back which began about 4 weeks ago while rebuilding his deck and doing some heavy work on his home. He describes his pain as tightness, stiffness which is constant. Over the last 1 week he's noticed increased pain for which he's describes as sharp. His pain radiates to the left thoracic side. His pain is worse with certain movements, cannot lay on left side.   He's been taking Tylenol 1300 mg 2-3 times daily and has been taking it easier at the gym, no significant improvement. He denies lumbar back pain, radiation of pain down his legs, lower extremity numbness, reduced ROM to upper extremities, upper extremity pain. He is taking Tizanidine 4 mg for his cervical spine DDD, this has not helped with his back pain.   He has an appointment scheduled with rheumatology in August for chronic back pain. He has not completed PT for his thoracic spine.   He underwent MR thoracic and lumbar spine in 2023 which revealed spondylitic spurring to the mid thoracic spine with edema suggesting active spondyloarthropathy without neural impingement.  Lumbar spine with degeneration primarily at L3-4 without high-grade stenosis.   Review of Systems  Musculoskeletal:  Positive for back pain.  Skin:  Negative for color change.  Neurological:  Negative for weakness and numbness.         Past Medical History:  Diagnosis Date   Acute deep vein thrombosis (DVT) of popliteal vein of right lower extremity (HCC) 01/25/2021    Unprovoked. Repeat venous ultrasound showed resolution of clot   DDD (degenerative disc disease), lumbar    GERD (gastroesophageal reflux disease)    HLD (hyperlipidemia)    Incomplete RBBB 05/08/2015   Obesity (BMI 35.0-39.9 without comorbidity)    Venous thromboembolism (VTE) 01/2021   RLE DVT + extensive bilateral PE (eliquis)   Vitamin D deficiency     Social History   Socioeconomic History   Marital status: Married    Spouse name: Not on file   Number of children: 2   Years of education: Not on file   Highest education level: Not on file  Occupational History   Occupation: Art gallery manager, [employer]    Employer: [employer]    Comment: [employer]  Tobacco Use   Smoking status: Never   Smokeless tobacco: Never  Vaping Use   Vaping status: Never Used  Substance and Sexual Activity   Alcohol use: Yes    Alcohol/week: 6.0 standard drinks of alcohol    Types: 6 drink(s) per week    Comment: weekly   Drug use: No   Sexual activity: Yes  Other Topics Concern   Not on file  Social History Narrative   Caffeine: 4-5 cups coffee/day   Lives with wife and 2 daughters, 3 dogs   Occupation: Production designer, theatre/television/film at [employer]: works out 6d/wk, cardio and [employer]   Diet: some water, fruits/vegetables daily, no fast food   Social Determinants of Corporate investment banker Strain: Not on  file  Food Insecurity: No Food Insecurity (03/03/2022)   Hunger Vital Sign    Worried About Running Out of Food in the Last Year: Never true    Ran Out of Food in the Last Year: Never true  Transportation Needs: No Transportation Needs (03/03/2022)   PRAPARE - Administrator, Civil Service (Medical): No    Lack of Transportation (Non-Medical): No  Physical Activity: Not on file  Stress: Not on file  Social Connections: Unknown (12/19/2021)   Received from [facility]   Social Network    Social Network: Not on file  Intimate Partner Violence: Unknown (11/10/2021)    Received from [facility]   HITS    Physically Hurt: Not on file    Insult or Talk Down To: Not on file    Threaten Physical Harm: Not on file    Scream or Curse: Not on file    Past Surgical History:  Procedure Laterality Date   CARDIOVASCULAR STRESS TEST  05/2015   ETT - WNL, EF 58% (Dr Jacinto Halim)   COLONOSCOPY WITH PROPOFOL N/A 01/18/2022   TA, HP, rpt 7 yrs Tobi Bastos, Sharlet Salina, MD)   CT CTA CORONARY W/CA SCORE W/CM &/OR WO/CM  05/2015   score of 6   LASIK  11/17/2009   Dr. Judie Grieve, [place]   PULMONARY THROMBECTOMY Bilateral 01/2021   TPA thrombolysis and thrombectomy for bilateral PE with R heart strain and hypoxia Lorretta Harp)   PULMONARY THROMBECTOMY Bilateral 01/12/2021   Procedure: PULMONARY THROMBECTOMY;  Surgeon: Renford Dills, MD;  Location: [facility] INVASIVE CV LAB;  Service: Cardiovascular;  Laterality: Bilateral;   TONSILLECTOMY  7 yoa   US ECHOCARDIOGRAPHY  05/2015   mild concentric LVH, EF 66%, mildly dilated aortic root 4.1cm    Family History  Problem Relation Age of Onset   Benign prostatic hyperplasia Father    Diabetes Maternal Grandfather    Heart failure Maternal Grandfather    Parkinsonism Paternal Grandmother    Stroke Paternal Grandfather    Coronary artery disease Neg Hx    Cancer Neg Hx     No Known Allergies  Current Outpatient Medications on File Prior to Visit  Medication Sig Dispense Refill   acetaminophen (TYLENOL) 650 MG CR tablet Take 650 mg by mouth every 8 (eight) hours as needed for pain.     apixaban (ELIQUIS) 5 MG TABS tablet Take 1 tablet (5 mg total) by mouth 2 (two) times daily. 180 tablet 4   Cholecalciferol (VITAMIN D3) 25 MCG (1000 UT) CAPS Take 1 capsule (1,000 Units total) by mouth daily. 30 capsule    fexofenadine-pseudoephedrine (ALLEGRA-D 24) 180-240 MG 24 hr tablet Take 1 tablet by mouth daily. Patient states takes as needed     tiZANidine (ZANAFLEX) 4 MG tablet TAKE 1 TABLET BY MOUTH AT BEDTIME AS NEEDED FOR MUSCLE  SPASMS. 30 tablet 6   diphenhydrAMINE (BENADRYL) 25 mg capsule Take 50 mg by mouth at bedtime as needed. (Patient not taking: Reported on 03/02/2023)     No current facility-administered medications on file prior to visit.    BP (!) 162/96   Pulse 60   Temp (!) 97.5 F (36.4 C) (Temporal)   Ht 5' 9.5" (1.765 m)   Wt 257 lb (116.6 kg)   SpO2 96%   BMI 37.41 kg/m  Objective:   Physical Exam Constitutional:      General: He is not in acute distress. Pulmonary:     Effort: Pulmonary effort is normal.  Musculoskeletal:     Right shoulder: Normal range of motion.     Left shoulder: Normal range of motion.     Thoracic back: No swelling, tenderness or bony tenderness. Decreased range of motion.     Lumbar back: No bony tenderness. Normal range of motion. Negative right straight leg raise test and negative left straight leg raise test.       Back:           Assessment & Plan:  Spondyloarthropathy Assessment & Plan: Acute on chronic flare secondary to recent strenuous physical activity.   Avoid back and upper body workouts at this time. Continue Tylenol as needed.  Toradol 60 mg provided today intramuscularly.  He is managed on Eliquis.  Start prednisone tablets.  Take 60 mg daily x 3 days, then 40 mg x 3 days, then 20 mg x 3 days. Follow-up with rheumatology as scheduled.  Reviewed MR thoracic and lumbar from 2023  Orders: -     predniSONE; Take 3 tablets my mouth once daily in the morning for 3, then 2 tablets for 3 days, then 1 tablet for 3 days.  Dispense: 18 tablet; Refill: 0        Doreene Nest, NP

## 2023-04-03 DIAGNOSIS — M545 Low back pain, unspecified: Secondary | ICD-10-CM | POA: Diagnosis not present

## 2023-04-03 DIAGNOSIS — G8929 Other chronic pain: Secondary | ICD-10-CM | POA: Diagnosis not present

## 2023-04-03 DIAGNOSIS — R9389 Abnormal findings on diagnostic imaging of other specified body structures: Secondary | ICD-10-CM | POA: Diagnosis not present

## 2023-04-03 DIAGNOSIS — Z111 Encounter for screening for respiratory tuberculosis: Secondary | ICD-10-CM | POA: Diagnosis not present

## 2023-04-03 DIAGNOSIS — M549 Dorsalgia, unspecified: Secondary | ICD-10-CM | POA: Diagnosis not present

## 2023-04-03 DIAGNOSIS — M47816 Spondylosis without myelopathy or radiculopathy, lumbar region: Secondary | ICD-10-CM | POA: Diagnosis not present

## 2023-05-15 DIAGNOSIS — M545 Low back pain, unspecified: Secondary | ICD-10-CM | POA: Diagnosis not present

## 2023-05-15 DIAGNOSIS — R9389 Abnormal findings on diagnostic imaging of other specified body structures: Secondary | ICD-10-CM | POA: Diagnosis not present

## 2023-05-15 DIAGNOSIS — G8929 Other chronic pain: Secondary | ICD-10-CM | POA: Diagnosis not present

## 2023-05-15 DIAGNOSIS — M549 Dorsalgia, unspecified: Secondary | ICD-10-CM | POA: Diagnosis not present

## 2023-05-23 DIAGNOSIS — R9389 Abnormal findings on diagnostic imaging of other specified body structures: Secondary | ICD-10-CM | POA: Diagnosis not present

## 2023-05-25 IMAGING — CR DG CHEST 2V
2 series · 2 of 2 positions shown · non-contrast
Comparison: 06/08/2014

CLINICAL DATA: Pain and swelling of right lower leg and calf.
Shortness of breath.

EXAM:
CHEST - 2 VIEW

[chest pa]
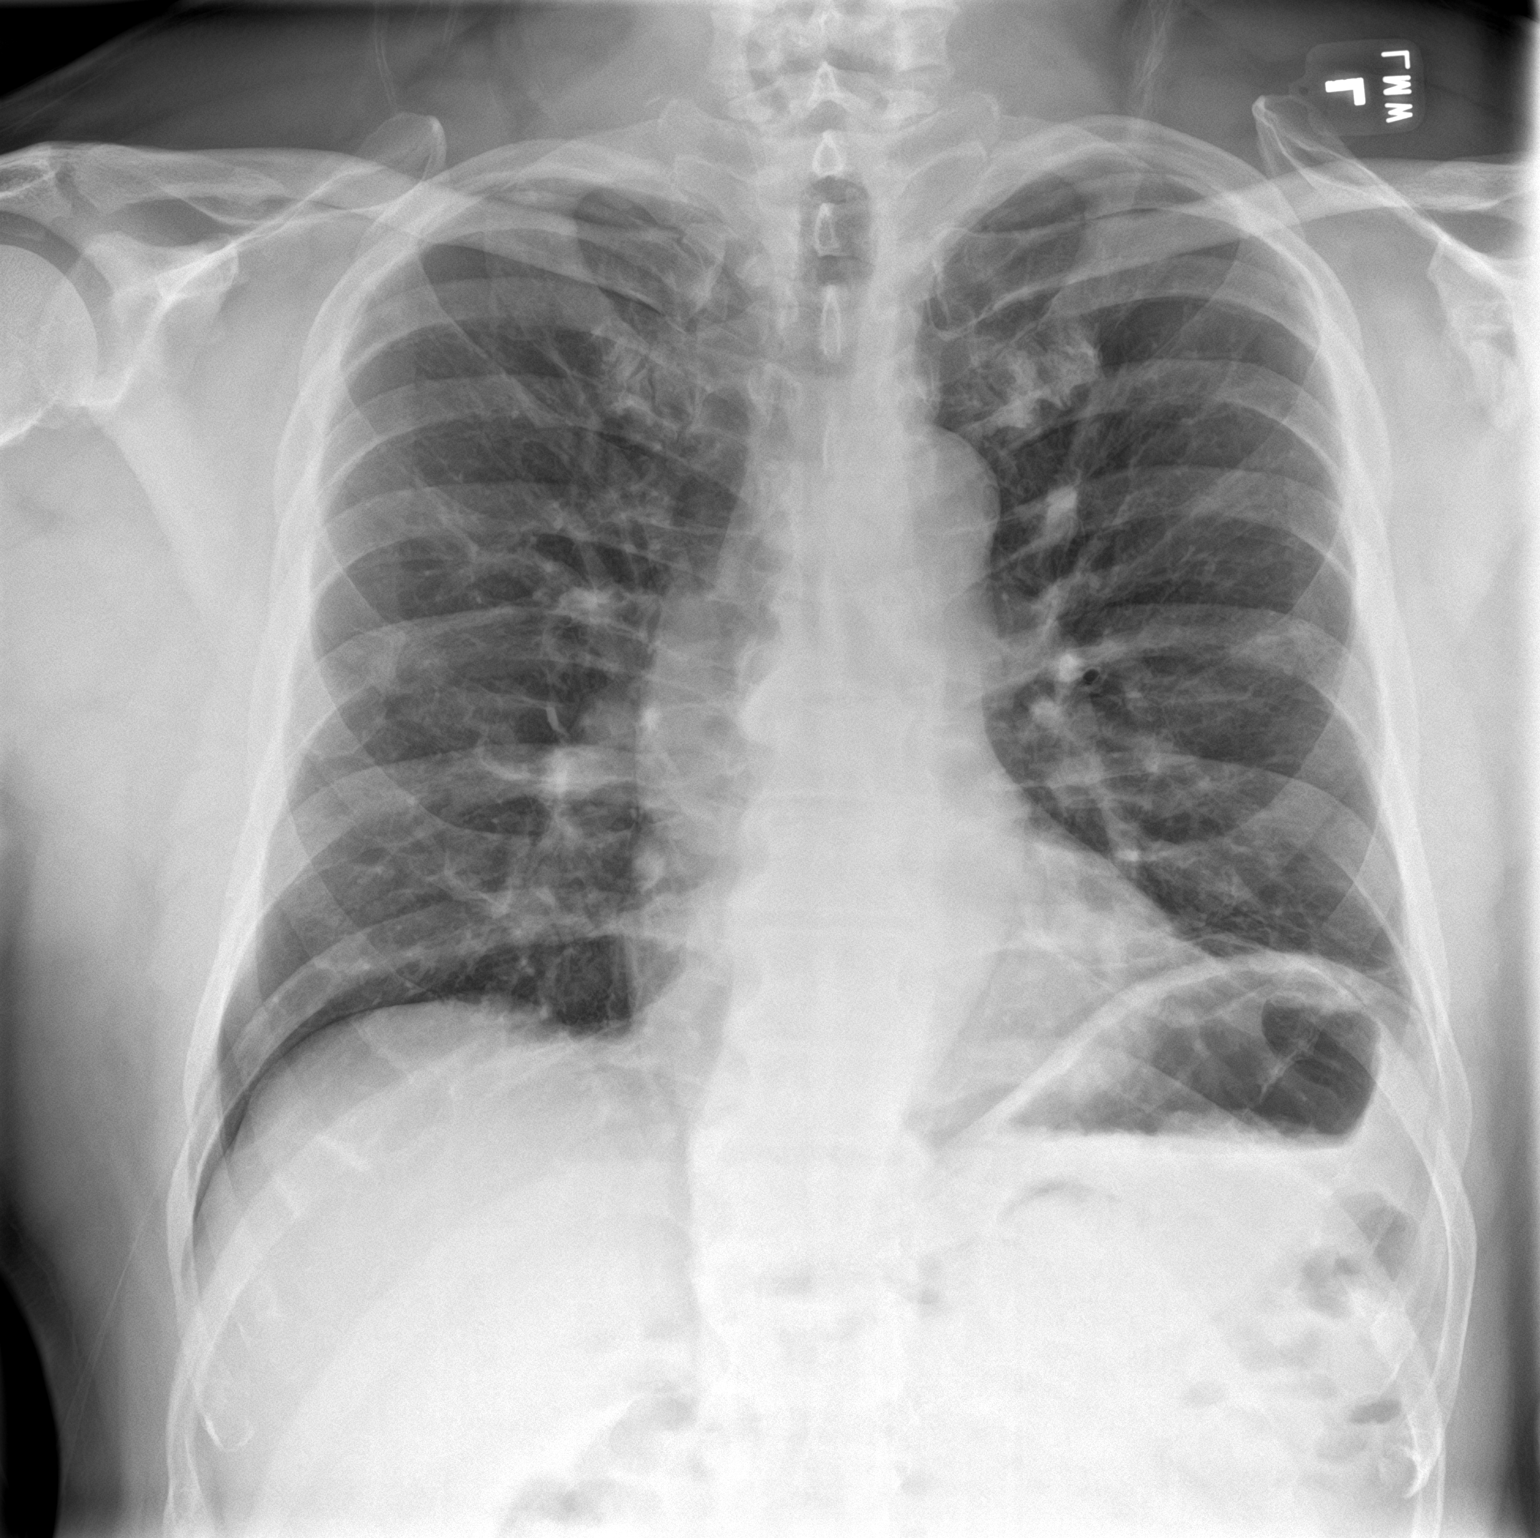

[chest lat]
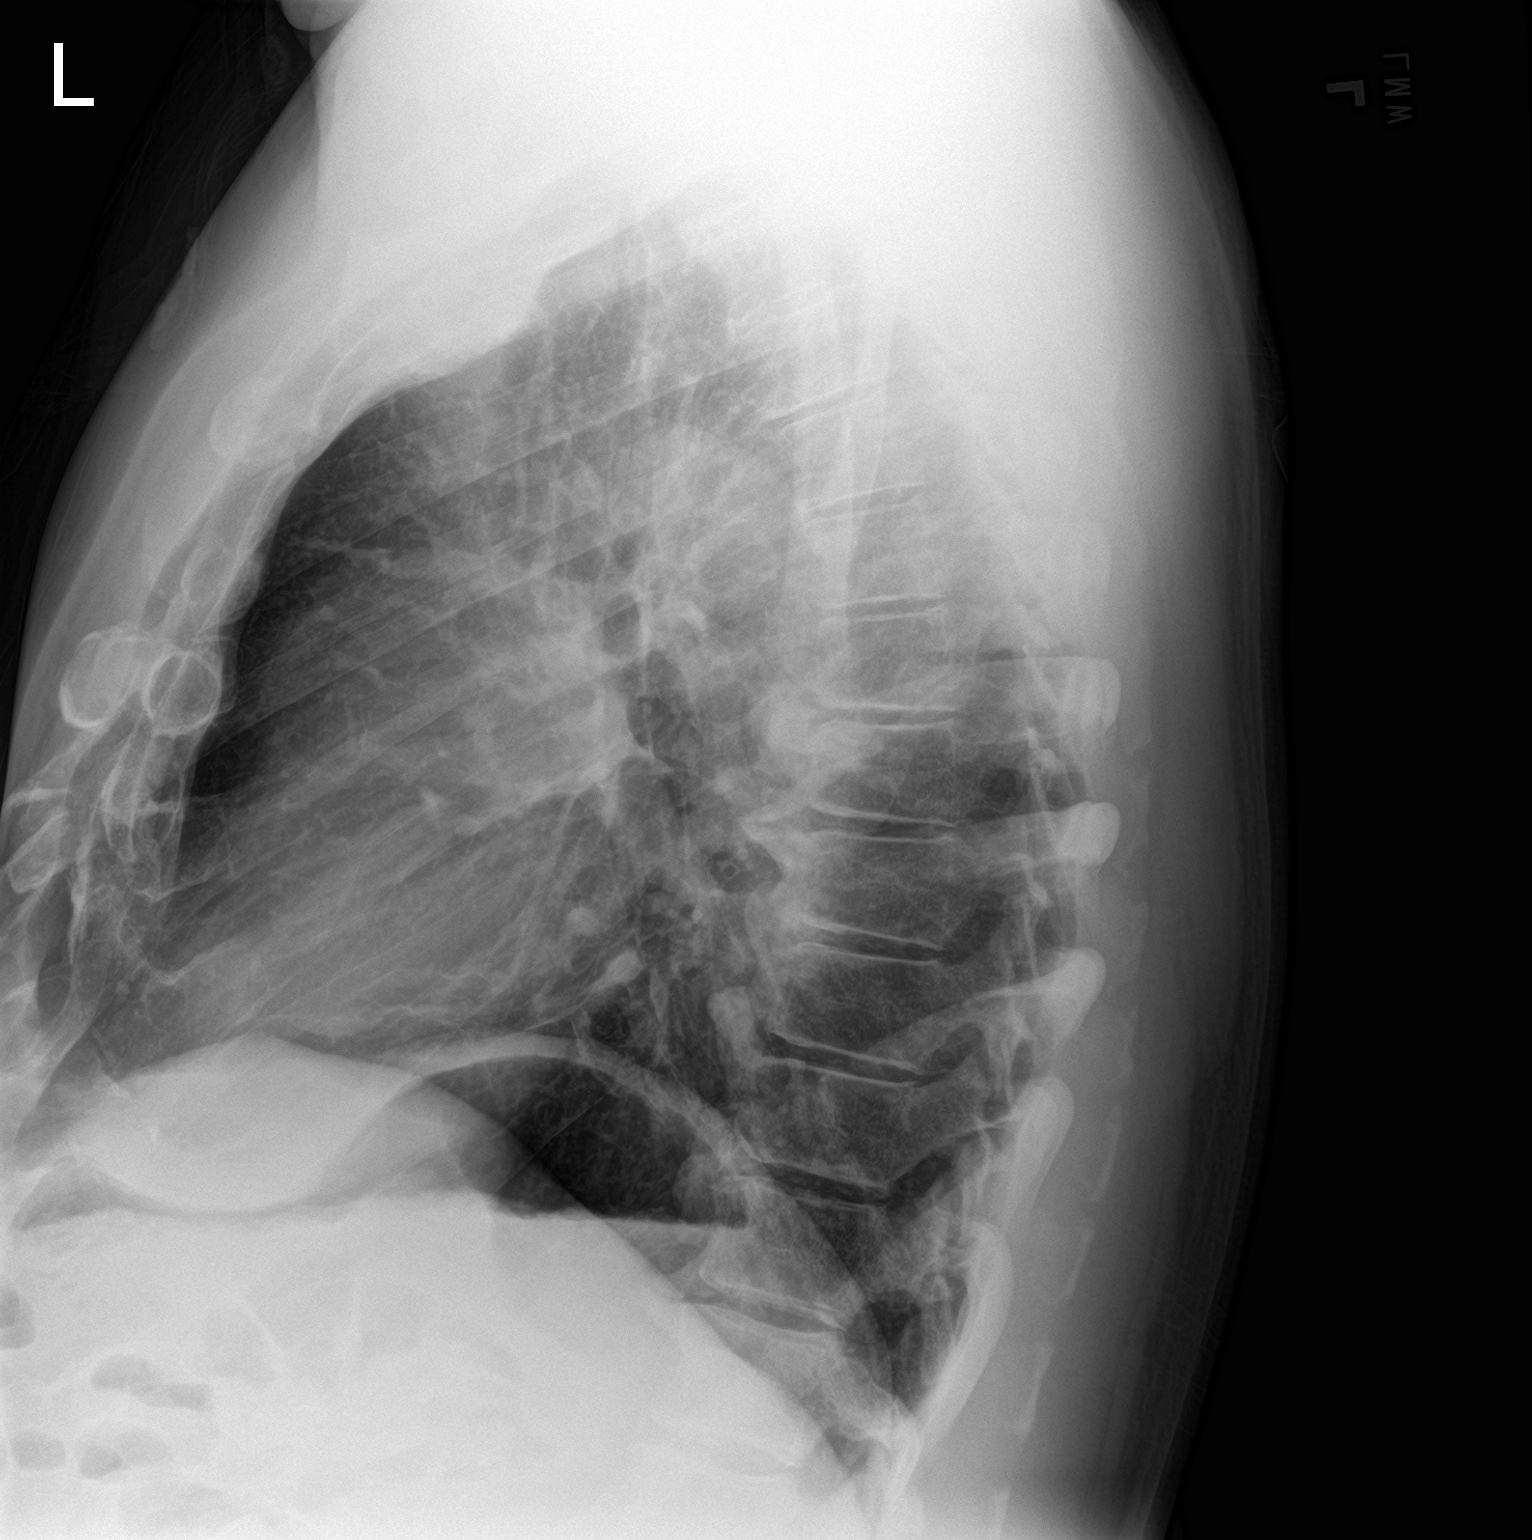

[2 of 2 positions shown; findings below may reference images not displayed]

FINDINGS: The heart size appears normal no pleural effusion or edema. No
airspace opacities. Spondylosis identified throughout the mid and
lower thoracic spine.
IMPRESSION: No active cardiopulmonary abnormalities.

## 2023-07-18 DIAGNOSIS — M545 Low back pain, unspecified: Secondary | ICD-10-CM | POA: Diagnosis not present

## 2023-07-18 DIAGNOSIS — G8929 Other chronic pain: Secondary | ICD-10-CM | POA: Diagnosis not present

## 2023-07-18 DIAGNOSIS — M549 Dorsalgia, unspecified: Secondary | ICD-10-CM | POA: Diagnosis not present

## 2023-12-19 ENCOUNTER — Other Ambulatory Visit: Payer: Self-pay | Admitting: Family Medicine

## 2023-12-19 DIAGNOSIS — N289 Disorder of kidney and ureter, unspecified: Secondary | ICD-10-CM

## 2023-12-19 DIAGNOSIS — Z125 Encounter for screening for malignant neoplasm of prostate: Secondary | ICD-10-CM

## 2023-12-19 DIAGNOSIS — M47819 Spondylosis without myelopathy or radiculopathy, site unspecified: Secondary | ICD-10-CM

## 2023-12-19 DIAGNOSIS — E559 Vitamin D deficiency, unspecified: Secondary | ICD-10-CM

## 2023-12-19 DIAGNOSIS — E785 Hyperlipidemia, unspecified: Secondary | ICD-10-CM

## 2023-12-25 ENCOUNTER — Ambulatory Visit: Payer: Self-pay | Admitting: Family Medicine

## 2023-12-25 ENCOUNTER — Other Ambulatory Visit (INDEPENDENT_AMBULATORY_CARE_PROVIDER_SITE_OTHER): Payer: BC Managed Care – PPO

## 2023-12-25 DIAGNOSIS — N289 Disorder of kidney and ureter, unspecified: Secondary | ICD-10-CM

## 2023-12-25 DIAGNOSIS — E559 Vitamin D deficiency, unspecified: Secondary | ICD-10-CM

## 2023-12-25 DIAGNOSIS — E785 Hyperlipidemia, unspecified: Secondary | ICD-10-CM | POA: Diagnosis not present

## 2023-12-25 DIAGNOSIS — Z125 Encounter for screening for malignant neoplasm of prostate: Secondary | ICD-10-CM | POA: Diagnosis not present

## 2023-12-25 LAB — COMPREHENSIVE METABOLIC PANEL WITH GFR
ALT: 25 U/L (ref 0–53)
AST: 24 U/L (ref 0–37)
Albumin: 4.2 g/dL (ref 3.5–5.2)
Alkaline Phosphatase: 64 U/L (ref 39–117)
BUN: 26 mg/dL — ABNORMAL HIGH (ref 6–23)
CO2: 25 meq/L (ref 19–32)
Calcium: 8.7 mg/dL (ref 8.4–10.5)
Chloride: 104 meq/L (ref 96–112)
Creatinine, Ser: 1.28 mg/dL (ref 0.40–1.50)
GFR: 60.31 mL/min (ref >60.00)
Glucose, Bld: 96 mg/dL (ref 70–99)
Potassium: 4.5 meq/L (ref 3.5–5.1)
Sodium: 139 meq/L (ref 135–145)
Total Bilirubin: 0.9 mg/dL (ref 0.2–1.2)
Total Protein: 6.3 g/dL (ref 6.0–8.3)

## 2023-12-25 LAB — CBC WITH DIFFERENTIAL/PLATELET
Basophils Absolute: 0 10*3/uL (ref 0.0–0.1)
Basophils Relative: 0.9 % (ref 0.0–3.0)
Eosinophils Absolute: 0.2 10*3/uL (ref 0.0–0.7)
Eosinophils Relative: 4.5 % (ref 0.0–5.0)
HCT: 42.6 % (ref 39.0–52.0)
Hemoglobin: 14.3 g/dL (ref 13.0–17.0)
Lymphocytes Relative: 38.7 % (ref 12.0–46.0)
Lymphs Abs: 1.8 10*3/uL (ref 0.7–4.0)
MCHC: 33.6 g/dL (ref 30.0–36.0)
MCV: 92.1 fl (ref 78.0–100.0)
Monocytes Absolute: 0.4 10*3/uL (ref 0.1–1.0)
Monocytes Relative: 8 % (ref 3.0–12.0)
Neutro Abs: 2.2 10*3/uL (ref 1.4–7.7)
Neutrophils Relative %: 47.9 % (ref 43.0–77.0)
Platelets: 223 10*3/uL (ref 150.0–400.0)
RBC: 4.62 Mil/uL (ref 4.22–5.81)
RDW: 13.4 % (ref 11.5–15.5)
WBC: 4.6 10*3/uL (ref 4.0–10.5)

## 2023-12-25 LAB — MICROALBUMIN / CREATININE URINE RATIO
Creatinine,U: 305.3 mg/dL
Microalb Creat Ratio: 3.7 mg/g (ref 0.0–30.0)
Microalb, Ur: 1.1 mg/dL (ref 0.0–1.9)

## 2023-12-25 LAB — LIPID PANEL
Cholesterol: 199 mg/dL (ref 0–200)
HDL: 64 mg/dL (ref >39.00)
LDL Cholesterol: 114 mg/dL — ABNORMAL HIGH (ref 0–99)
NonHDL: 134.81
Total CHOL/HDL Ratio: 3
Triglycerides: 106 mg/dL (ref 0.0–149.0)
VLDL: 21.2 mg/dL (ref 0.0–40.0)

## 2023-12-25 LAB — VITAMIN D 25 HYDROXY (VIT D DEFICIENCY, FRACTURES): VITD: 25.84 ng/mL — ABNORMAL LOW (ref 30.00–100.00)

## 2023-12-25 LAB — TSH: TSH: 2.12 u[IU]/mL (ref 0.35–5.50)

## 2023-12-25 LAB — PSA: PSA: 0.6 ng/mL (ref 0.10–4.00)

## 2024-01-01 ENCOUNTER — Encounter: Payer: Self-pay | Admitting: Family Medicine

## 2024-01-01 ENCOUNTER — Ambulatory Visit (INDEPENDENT_AMBULATORY_CARE_PROVIDER_SITE_OTHER): Payer: BC Managed Care – PPO | Admitting: Family Medicine

## 2024-01-01 VITALS — BP 132/80 | HR 64 | Temp 98.0°F | Ht 69.5 in | Wt 259.1 lb

## 2024-01-01 DIAGNOSIS — M47819 Spondylosis without myelopathy or radiculopathy, site unspecified: Secondary | ICD-10-CM

## 2024-01-01 DIAGNOSIS — E559 Vitamin D deficiency, unspecified: Secondary | ICD-10-CM

## 2024-01-01 DIAGNOSIS — R030 Elevated blood-pressure reading, without diagnosis of hypertension: Secondary | ICD-10-CM

## 2024-01-01 DIAGNOSIS — Z86718 Personal history of other venous thrombosis and embolism: Secondary | ICD-10-CM

## 2024-01-01 DIAGNOSIS — N289 Disorder of kidney and ureter, unspecified: Secondary | ICD-10-CM

## 2024-01-01 DIAGNOSIS — Z86711 Personal history of pulmonary embolism: Secondary | ICD-10-CM

## 2024-01-01 DIAGNOSIS — E785 Hyperlipidemia, unspecified: Secondary | ICD-10-CM

## 2024-01-01 DIAGNOSIS — J302 Other seasonal allergic rhinitis: Secondary | ICD-10-CM | POA: Diagnosis not present

## 2024-01-01 DIAGNOSIS — Z0000 Encounter for general adult medical examination without abnormal findings: Secondary | ICD-10-CM | POA: Diagnosis not present

## 2024-01-01 DIAGNOSIS — Q828 Other specified congenital malformations of skin: Secondary | ICD-10-CM

## 2024-01-01 DIAGNOSIS — M5030 Other cervical disc degeneration, unspecified cervical region: Secondary | ICD-10-CM

## 2024-01-01 MED ORDER — TIZANIDINE HCL 4 MG PO TABS: ORAL_TABLET | ORAL | 6 refills | Status: AC

## 2024-01-01 MED ORDER — FEXOFENADINE HCL 180 MG PO TABS: 180.0000 mg | ORAL_TABLET | Freq: Every day | ORAL | Status: AC

## 2024-01-01 MED ORDER — APIXABAN 5 MG PO TABS: 5.0000 mg | ORAL_TABLET | Freq: Two times a day (BID) | ORAL | 4 refills | Status: AC

## 2024-01-01 MED ORDER — VITAMIN D3 50 MCG (2000 UT) PO CAPS: 4000.0000 [IU] | ORAL_CAPSULE | Freq: Every day | ORAL | Status: AC

## 2024-01-01 NOTE — Assessment & Plan Note (Signed)
 Levels remain low despite 2000 units regular replacement with 2000 units  Will increase to 4000 units daily.

## 2024-01-01 NOTE — Patient Instructions (Addendum)
 Avoid decongestants which can raise blood pressures. BP was high today - but improved on recheck. Keep an eye out on BP at home every few weeks to ensure not trending up.  You are doing well today Return as needed or in 1 year for next physical.

## 2024-01-01 NOTE — Assessment & Plan Note (Addendum)
 Continue to encourage healthy diet and lifestyle choices.  High muscle mass contributes to increased weight.

## 2024-01-01 NOTE — Assessment & Plan Note (Signed)
 Saw rheum, not consistent with AS. Will continue to monitor. Managing with topical voltaren  and tizanidine  PRN

## 2024-01-01 NOTE — Assessment & Plan Note (Addendum)
 BP normalizes on recheck.

## 2024-01-01 NOTE — Assessment & Plan Note (Signed)
 Preventative protocols reviewed and updated unless pt declined. Discussed healthy diet and lifestyle.

## 2024-01-01 NOTE — Progress Notes (Signed)
 Ph: [phone] Fax: [phone]   Patient ID: Alejandro Decker, male    DOB: Mar 18, 1961, 63 y.o.   MRN: [id number]  This visit was conducted in person.  BP 132/80 (BP Location: Right Arm, Cuff Size: Large)   Pulse 64   Temp 98 F (36.7 C) (Oral)   Ht 5' 9.5" (1.765 m)   Wt 259 lb 2 oz (117.5 kg)   SpO2 97%   BMI 37.72 kg/m   BP Readings from Last 3 Encounters:  01/01/24 132/80  03/02/23 (!) 162/96  12/29/22 (!) 146/84   CC: CPE Subjective:   HPI: Alejandro Decker is a 63 y.o. 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 Hypercoagulable panel returned normal (normal FVL, PT gene, protein C or C&S, AT3, anti-phospholipid antibody). Repeat venous US  showed resolution of RLE DVT. Has been recommended compression stockings as well. 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    Acute back pain spondyloarthropathy 02/2022 s/p ER visit. Thought may have been related to exercise routine he was doing. Treated with prednisone  and oxycodone  with resolution. Saw rheumatology latest 07/2023 - overall not consistent with ankylosing spondylitis. Uses muscle relaxant and topical voltaren  with benefit.    Preventative: COLONOSCOPY WITH PROPOFOL  01/18/2022 - TA, [facility], rpt 7 yrs Antony Baumgartner, Lenton Rail, MD)  Prostate cancer screening - yearly PSA. No BPH symptom.  Lung cancer screening - not eligible  Flu yearly at work  COVID vaccine J&J 11/2019, Moderna booster 06/2020  Td 2010, Tdap 12/2020 Shingrix - 12/2020, 04/2021  RSV - discussed  Seat belt use discussed Sunscreen use discussed. No suspicious moles noted. Sees [place] [facility], h/o porokeratosis 05/2018. 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 Interim medical history since our last visit reviewed. Allergies and medications reviewed and updated. Outpatient Medications Prior to Visit  Medication Sig Dispense Refill   acetaminophen  (TYLENOL ) 650 MG CR tablet Take 650 mg by mouth every 8 (eight) hours as needed for pain.     diclofenac  Sodium (VOLTAREN  ARTHRITIS PAIN) 1 % GEL      diphenhydrAMINE (BENADRYL) 25 mg capsule Take 50 mg by mouth at bedtime as needed.     apixaban  (ELIQUIS ) 5 MG TABS tablet Take 1 tablet (5 mg total) by mouth 2 (two) times daily. 180 tablet 4   Cholecalciferol (VITAMIN D3) 25 MCG (1000 UT) CAPS Take 1 capsule (1,000 Units total) by mouth daily. 30 capsule    fexofenadine-pseudoephedrine (ALLEGRA-D 24) 180-240 MG 24 hr tablet Take 1 tablet by mouth daily. Patient states takes as needed     tiZANidine  (ZANAFLEX ) 4 MG tablet TAKE 1 TABLET BY MOUTH AT BEDTIME AS NEEDED FOR MUSCLE SPASMS. 30 tablet 6   predniSONE  (DELTASONE ) 20 MG tablet Take 3 tablets my mouth once daily in the morning for 3, then 2 tablets for 3 days, then 1 tablet for  3 days. 18 tablet 0   No facility-administered medications prior to visit.     Per HPI unless specifically indicated in ROS section below Review of Systems  Constitutional:  Negative for activity change, appetite change, chills, fatigue, fever and unexpected weight change.  HENT:  Negative for hearing loss.   Eyes:  Negative for visual disturbance.  Respiratory:  Negative for cough, chest tightness, shortness of breath and wheezing.   Cardiovascular:  Negative for chest pain, palpitations and leg swelling.  Gastrointestinal:  Negative for  abdominal distention, abdominal pain, blood in stool, constipation, diarrhea, nausea and vomiting.  Genitourinary:  Negative for difficulty urinating and hematuria.  Musculoskeletal:  Negative for arthralgias, myalgias and neck pain.  Skin:  Negative for rash.  Neurological:  Negative for dizziness, seizures, syncope and headaches.  Hematological:  Negative for adenopathy. Does not bruise/bleed easily.  Psychiatric/Behavioral:  Negative for dysphoric mood. The patient is not nervous/anxious.     Objective:  BP 132/80 (BP Location: Right Arm, Cuff Size: Large)   Pulse 64   Temp 98 F (36.7 C) (Oral)   Ht 5' 9.5" (1.765 m)   Wt 259 lb 2 oz (117.5 kg)   SpO2 97%   BMI 37.72 kg/m   Wt Readings from Last 3 Encounters:  01/01/24 259 lb 2 oz (117.5 kg)  03/02/23 257 lb (116.6 kg)  12/29/22 262 lb 6 oz (119 kg)      Physical Exam Vitals and nursing note reviewed.  Constitutional:      General: He is not in acute distress.    Appearance: Normal appearance. He is well-developed. He is not ill-appearing.  HENT:     Head: Normocephalic and atraumatic.     Right Ear: Hearing, tympanic membrane, ear canal and external ear normal.     Left Ear: Hearing, tympanic membrane, ear canal and external ear normal.     Mouth/Throat:     Mouth: Mucous membranes are moist.     Pharynx: Oropharynx is clear. No oropharyngeal exudate or posterior oropharyngeal erythema.  Eyes:     General: No scleral icterus.    Extraocular Movements: Extraocular movements intact.     Conjunctiva/sclera: Conjunctivae normal.     Pupils: Pupils are equal, round, and reactive to light.  Neck:     Thyroid: No thyroid mass or thyromegaly.  Cardiovascular:     Rate and Rhythm: Normal rate and regular rhythm.     Pulses: Normal pulses.          Radial pulses are 2+ on the right side and 2+ on the left side.     Heart sounds: Normal heart sounds. No murmur heard. Pulmonary:     Effort: Pulmonary effort is normal. No  respiratory distress.     Breath sounds: Normal breath sounds. No wheezing, rhonchi or rales.  Abdominal:     General: Bowel sounds are normal. There is no distension.     Palpations: Abdomen is soft. There is no mass.     Tenderness: There is no abdominal tenderness. There is no guarding or rebound.     Hernia: No hernia is present.  Musculoskeletal:        General: Normal range of motion.     Cervical back: Normal range of motion and neck supple.     Right lower leg: No edema.     Left lower leg: No edema.  Lymphadenopathy:     Cervical: No cervical adenopathy.  Skin:    General: Skin is warm  and dry.     Findings: No rash.  Neurological:     General: No focal deficit present.     Mental Status: He is alert and oriented to person, place, and time.  Psychiatric:        Mood and Affect: Mood normal.        Behavior: Behavior normal.        Thought Content: Thought content normal.        Judgment: Judgment normal.       Results for orders placed or performed in visit on 12/25/23  TSH   Collection Time: 12/25/23  7:37 AM  Result Value Ref Range   TSH 2.12 0.35 - 5.50 uIU/mL  PSA   Collection Time: 12/25/23  7:37 AM  Result Value Ref Range   PSA 0.60 0.10 - 4.00 ng/mL  CBC with Differential/Platelet   Collection Time: 12/25/23  7:37 AM  Result Value Ref Range   WBC 4.6 4.0 - 10.5 K/uL   RBC 4.62 4.22 - 5.81 Mil/uL   Hemoglobin 14.3 13.0 - 17.0 g/dL   HCT 47.8 29.5 - 62.1 %   MCV 92.1 78.0 - 100.0 fl   MCHC 33.6 30.0 - 36.0 g/dL   RDW 30.8 65.7 - 84.6 %   Platelets 223.0 150.0 - 400.0 K/uL   Neutrophils Relative % 47.9 43.0 - 77.0 %   Lymphocytes Relative 38.7 12.0 - 46.0 %   Monocytes Relative 8.0 3.0 - 12.0 %   Eosinophils Relative 4.5 0.0 - 5.0 %   Basophils Relative 0.9 0.0 - 3.0 %   Neutro Abs 2.2 1.4 - 7.7 K/uL   Lymphs Abs 1.8 0.7 - 4.0 K/uL   Monocytes Absolute 0.4 0.1 - 1.0 K/uL   Eosinophils Absolute 0.2 0.0 - 0.7 K/uL   Basophils Absolute 0.0 0.0 - 0.1  K/uL  Microalbumin / creatinine urine ratio   Collection Time: 12/25/23  7:37 AM  Result Value Ref Range   Microalb, Ur 1.1 0.0 - 1.9 mg/dL   Creatinine,U 962.9 mg/dL   Microalb Creat Ratio 3.7 0.0 - 30.0 mg/g  VITAMIN D  25 Hydroxy (Vit-D Deficiency, Fractures)   Collection Time: 12/25/23  7:37 AM  Result Value Ref Range   VITD 25.84 (L) 30.00 - 100.00 ng/mL  Comprehensive metabolic panel with GFR   Collection Time: 12/25/23  7:37 AM  Result Value Ref Range   Sodium 139 135 - 145 mEq/L   Potassium 4.5 3.5 - 5.1 mEq/L   Chloride 104 96 - 112 mEq/L   CO2 25 19 - 32 mEq/L   Glucose, Bld 96 70 - 99 mg/dL   BUN 26 (H) 6 - 23 mg/dL   Creatinine, Ser 5.28 0.40 - 1.50 mg/dL   Total Bilirubin 0.9 0.2 - 1.2 mg/dL   Alkaline Phosphatase 64 39 - 117 U/L   AST 24 0 - 37 U/L   ALT 25 0 - 53 U/L   Total Protein 6.3 6.0 - 8.3 g/dL   Albumin 4.2 3.5 - 5.2 g/dL   GFR 41.32 >44.01 mL/min   Calcium 8.7 8.4 - 10.5 mg/dL  Lipid panel   Collection Time: 12/25/23  7:37 AM  Result Value Ref Range   Cholesterol 199 0 - 200 mg/dL   Triglycerides 027.2 0.0 - 149.0 mg/dL   HDL 53.66 >44.03 mg/dL   VLDL 47.4 0.0 - 25.9 mg/dL   LDL Cholesterol 563 (H) 0 - 99 mg/dL   Total CHOL/HDL Ratio 3    NonHDL 134.81  Assessment & Plan:   Problem List Items Addressed This Visit     Healthcare maintenance - Primary (Chronic)   Preventative protocols reviewed and updated unless pt declined. Discussed healthy diet and lifestyle.       HLD (hyperlipidemia)   Chronic, mild off medication. No fmhx CAD.  Statin caused bump in LFTs, saw cards, rec treatment if LDL >130 Discussed possible cardiac CT with calcium scoring for further risk stratification - he will consider next year.  The 10-year ASCVD risk score (Arnett DK, et al., 2019) is: 8.3%   Values used to calculate the score:     Age: 2 years     Sex: Male     Is Non-Hispanic African American: No     Diabetic: No     Tobacco smoker: No      Systolic Blood Pressure: 132 mmHg     Is BP treated: No     HDL Cholesterol: 64 mg/dL     Total Cholesterol: 199 mg/dL       Relevant Medications   apixaban  (ELIQUIS ) 5 MG TABS tablet   Allergic rhinitis   Rec avoid decongestant use.       Severe obesity (BMI 35.0-39.9) with comorbidity (HCC)   Continue to encourage healthy diet and lifestyle choices.  High muscle mass contributes to increased weight.       Porokeratosis   Renal insufficiency   Kidney function actually improved compared to prior years- continue to monitor.       History of pulmonary embolism   Continue lifelong anticoagulation.       History of venous thromboembolism   Relevant Medications   apixaban  (ELIQUIS ) 5 MG TABS tablet   DDD (degenerative disc disease), cervical   Relevant Medications   tiZANidine  (ZANAFLEX ) 4 MG tablet   Vitamin D  deficiency   Levels remain low despite 2000 units regular replacement with 2000 units  Will increase to 4000 units daily.       Elevated blood pressure reading in office without diagnosis of hypertension   BP normalizes on recheck.       Spondyloarthropathy   Saw rheum, not consistent with AS. Will continue to monitor. Managing with topical voltaren  and tizanidine  PRN        Meds ordered this encounter  Medications   apixaban  (ELIQUIS ) 5 MG TABS tablet    Sig: Take 1 tablet (5 mg total) by mouth 2 (two) times daily.    Dispense:  180 tablet    Refill:  4   tiZANidine  (ZANAFLEX ) 4 MG tablet    Sig: TAKE 1 TABLET BY MOUTH AT BEDTIME AS NEEDED FOR MUSCLE SPASMS.    Dispense:  30 tablet    Refill:  6   fexofenadine (ALLEGRA ALLERGY) 180 MG tablet    Sig: Take 1 tablet (180 mg total) by mouth daily.   Cholecalciferol (VITAMIN D3) 50 MCG (2000 UT) capsule    Sig: Take 2 capsules (4,000 Units total) by mouth daily.    No orders of the defined types were placed in this encounter.   Patient Instructions  Avoid decongestants which can raise blood  pressures. BP was high today - but improved on recheck. Keep an eye out on BP at home every few weeks to ensure not trending up.  You are doing well today Return as needed or in 1 year for next physical.   Follow up plan: Return in about 1 year (around 12/31/2024) for annual exam, prior fasting for blood work.  Claire Crick, MD

## 2024-01-01 NOTE — Assessment & Plan Note (Signed)
 Kidney function actually improved compared to prior years- continue to monitor.

## 2024-01-01 NOTE — Assessment & Plan Note (Addendum)
 Chronic, mild off medication. No fmhx CAD.  Statin caused bump in LFTs, saw cards, rec treatment if LDL >130 Discussed possible cardiac CT with calcium scoring for further risk stratification - he will consider next year.  The 10-year ASCVD risk score (Arnett DK, et al., 2019) is: 8.3%   Values used to calculate the score:     Age: 63 years     Sex: Male     Is Non-Hispanic African American: No     Diabetic: No     Tobacco smoker: No     Systolic Blood Pressure: 132 mmHg     Is BP treated: No     HDL Cholesterol: 64 mg/dL     Total Cholesterol: 199 mg/dL

## 2024-01-01 NOTE — Assessment & Plan Note (Signed)
Continue lifelong anticoagulation 

## 2024-01-01 NOTE — Assessment & Plan Note (Signed)
 Rec avoid decongestant use.

## 2024-01-03 DIAGNOSIS — L821 Other seborrheic keratosis: Secondary | ICD-10-CM | POA: Diagnosis not present

## 2024-01-03 DIAGNOSIS — L814 Other melanin hyperpigmentation: Secondary | ICD-10-CM | POA: Diagnosis not present

## 2024-01-03 DIAGNOSIS — L578 Other skin changes due to chronic exposure to nonionizing radiation: Secondary | ICD-10-CM | POA: Diagnosis not present

## 2024-01-03 DIAGNOSIS — D225 Melanocytic nevi of trunk: Secondary | ICD-10-CM | POA: Diagnosis not present

## 2024-01-28 ENCOUNTER — Encounter: Payer: Self-pay | Admitting: Family Medicine

## 2024-12-30 ENCOUNTER — Other Ambulatory Visit

## 2025-01-06 ENCOUNTER — Encounter: Admitting: Family Medicine
